# Patient Record
Sex: Female | Born: 1970 | Hispanic: No | Marital: Single | Smoking: Never smoker
Health system: Southern US, Academic
[De-identification: ages and names within clinical notes are randomized; demographics above are authoritative.]

## PROBLEM LIST (undated history)

## (undated) DIAGNOSIS — E119 Type 2 diabetes mellitus without complications: Secondary | ICD-10-CM

## (undated) DIAGNOSIS — F32A Depression, unspecified: Secondary | ICD-10-CM

## (undated) DIAGNOSIS — F419 Anxiety disorder, unspecified: Secondary | ICD-10-CM

## (undated) DIAGNOSIS — F329 Major depressive disorder, single episode, unspecified: Secondary | ICD-10-CM

## (undated) DIAGNOSIS — J45909 Unspecified asthma, uncomplicated: Secondary | ICD-10-CM

## (undated) HISTORY — PX: CHOLECYSTECTOMY: SHX55

## (undated) HISTORY — DX: Unspecified asthma, uncomplicated: J45.909

## (undated) HISTORY — DX: Anxiety disorder, unspecified: F41.9

## (undated) HISTORY — DX: Depression, unspecified: F32.A

## (undated) HISTORY — DX: Major depressive disorder, single episode, unspecified: F32.9

## (undated) HISTORY — PX: HX ENDOMETRIAL ABLATION: 2100001129

## (undated) HISTORY — PX: HX GALL BLADDER SURGERY/CHOLE: SHX55

## (undated) HISTORY — PX: HX GASTRIC BYPASS: SHX52

## (undated) HISTORY — DX: Type 2 diabetes mellitus without complications: E11.9

## (undated) HISTORY — PX: HX CARDIAC ABLATION: 2100001323

## (undated) HISTORY — PX: HX PACEMAKER INSERTION: 2100001161

## (undated) NOTE — Progress Notes (Signed)
Formatting of this note is different from the original.  Images from the original note were not included.    Cardiology Office Note:      Date:  03/08/2022     ID:  Grace Perez, DOB 04/03/1971, MRN [id number]    PCP:  Jamse Mead, MD   Cardiologist:  Garwin Brothers, MD     Referring MD: Jamse Mead, MD     ASSESSMENT:      1. Frequent PVCs    2. S/P bariatric surgery    3. Nonischemic cardiomyopathy (HCC)    4. Symptomatic PVCs      PLAN:      In order of problems listed above:    Primary prevention stressed with the patient.  Importance of compliance with diet and medication stressed and she vocalized understanding.  Nonischemic cardiomyopathy: I discussed findings with the patient at length.  Guideline directed medical therapy was considered.  Her blood pressure is very borderline.  She is concerned about hypotension with medication such as Entresto.  EKG done today reveals sinus rhythm and nonspecific ST-T changes.    Frequent PVCs with cardiomyopathy: Patient request second opinion and she mentions to me that she would seek an opinion at a tertiary center.  She was happy with the services that we have provided.  She will be seen in follow-up appointment in 9 months or earlier if she has any concerns.  I have mentioned to her that I would help her in every way such as providing all our records and sending a copy to whoever she wishes to see for this evaluation.  Patient had multiple questions which were answered to her satisfaction.    Medication Adjustments/Labs and Tests Ordered:  Current medicines are reviewed at length with the patient today.  Concerns regarding medicines are outlined above.   Orders Placed This Encounter   Procedures    EKG 12-Lead     No orders of the defined types were placed in this encounter.    Chief Complaint   Patient presents with    Dizziness    heart fluttering       History of Present Illness:      Grace Perez is a 25 y.o. female.  Patient has past medical  history of PVCs which are frequent and symptomatic.  She was concerned about it.  She was sent for ablation and possible evaluation for the same.  The ablation was not contacted as it was thought that the origin of the PVCs were close to the His bundle.  Subsequently the patient has undergone coronary angiography with anatomically normal-appearing coronary arteries and moderately depressed ejection fraction.  She is very concerned because she is very symptomatic from the PVCs and this affects her quality of life.  She is requesting a second opinion.  At the time of my evaluation, the patient is alert awake oriented and in no distress.    Past Medical History:   Diagnosis Date    Acute cystitis without hematuria 07/06/2021    Last Assessment & Plan:  Formatting of this note might be different from the original. POCT UA in the office today reveals + nitrites and hematuria. Will send UA for micro and reflex culture and treat with ABX as appropriate.    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 Patient reports decreased clitoral sensation in decrease or gas mixed response.  We discussed options  of compound medication which include constituents of nitroglycerin to improve blood flow.  [facility] in [place] provides 3 levels of compound cream.  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 Discussed findings of atrophic vaginitis. The pathophysiology of this disease process and the effects upon the vaginal epithelium and urothelium were reviewed in detail.   Options of local vaginal estrogen cream/pills/oral SERM were discussed. Benefits include restoration of  normal vaginal pH and thickening of     Cardiac murmur 07/15/2021    Cervical radiculitis 05/03/2017    Chest pain of  uncertain etiology 07/15/2021    Chronic midline low back pain without sciatica 06/28/2018    Formatting of this note might be different from the original. Last Assessment & Plan:  Formatting of this note might be different from the original. We discussed that at this time given she would not want any surgical intervention and does not have any worsening red flag symptoms, she would be unlikely to benefit from MRI. Will continue to work on weight loss to reduce weight on spine. Last Assess    Class 3 severe obesity due to excess calories with serious comorbidity and body mass index (BMI) of 45.0 to 49.9 in adult [facility]) 04/12/2018    Formatting of this note might be different from the original. Last Assessment & Plan:  Formatting of this note might be different from the original. BMI Assessment: Current Body mass index is 48.21 kg/m.  Patient BMI currently is above average (>25 kg/m2); BMI follow up plan is completed . Current barriers to healthy weight management include none.  BMI Plan:  Today, Alyssamae and I have discussed     COVID-19 virus infection 05/08/2020    Last Assessment & Plan:  Formatting of this note might be different from the original. Continues to be symptomatic with fatigue. Will provide work note for a few more days to see if she is able to return to work.    Depression     Dermatitis 08/18/2021    Last Assessment & Plan:  Formatting of this note might be different from the original. Chronic worse. Likely due to recurrent irritation from skin due to weight loss. Recommend cortisone bid until rash improves.    Essential hypertension 04/28/2016    Formatting of this note might be different from the original. Last Assessment & Plan:  Formatting of this note might be different from the original. Chronic, stable. Continue current rx regimen. Last Assessment & Plan:  Formatting of this note might be different from the original. Chronic, stable. Controlled. Continue with current regimen.    Frequent PVCs  07/15/2021    Gastroesophageal reflux disease 04/28/2016    Formatting of this note might be different from the original. Last Assessment & Plan:  Formatting of this note might be different from the original. Fu with GI for egd Last Assessment & Plan:  Formatting of this note might be different from the original. Fu with GI for egd    History of recurrent UTIs 07/06/2021    Last Assessment & Plan:  Formatting of this note might be different from the original. Patient reports longstanding history of UTIs. Discussed the importance of needing urine cultures when patient symptomatic with UTI since she is having recurrence of symptoms. She does have +nitrites  in urine today, will send for UA/culture today.    Hypokalemia 05/08/2020    Lymphadenopathy 10/17/2018    Last Assessment & Plan:  Formatting of this note might be different from the original. Improved on CT scan. Will plan for repeat in 3 months    Metabolic syndrome X 04/28/2016    Mixed anxiety and depressive disorder 05/03/2017    Mixed hyperlipidemia 05/31/2020    Last Assessment & Plan:  Formatting of this note might be different from the original. Chronic, not controlled. Will stop lipitor and change to crestor. Repeat labs prior to next visit.    Moderate obstructive sleep apnea 08/04/2020    Moderate persistent asthma without complication 12/05/2019    Last Assessment & Plan:  Formatting of this note might be different from the original. Chronic, stable. Controlled. Continue with current regimen.    Overactive bladder 07/06/2021    Last Assessment & Plan:  Formatting of this note might be different from the original. Given the patient's irritative voiding symptoms of urgency and/or frequency and/or nocturia, we will proceed with urodynamic testing. The [facility] urodynamic testing fact sheet was provided to the patient after review in the office. Patient will return the office after investigative procedures to review her results     Overweight with body mass  index (BMI) of 29 to 29.9 in adult 04/12/2018    Last Assessment & Plan:  Formatting of this note might be different from the original. BMI Assessment: Current Body mass index is 37 kg/m.  Patient BMI currently is above average (>25 kg/m2); BMI follow up plan is completed . Current barriers to healthy weight management include none.  BMI Plan:  Today, Bonny and I have discussed methods to help address her current weight.   congratulated on we    Palpitations 07/15/2021    Paresthesias 10/06/2021    Last Assessment & Plan:  Formatting of this note might be different from the original. Recommend checking labs given she is s/p gastric bypass. Has not had these checked in about 6 months. MRI w/wo and referral to neurology input. She's aware of ER precautions.    Primary insomnia 12/02/2021    Last Assessment & Plan:  Formatting of this note might be different from the original. Discussed good sleep hygiene habits.    Protein-calorie malnutrition (HCC) 10/15/2020    PVC's (premature ventricular contractions) 02/17/2022    Right sided numbness 05/08/2020    Last Assessment & Plan:  Formatting of this note might be different from the original. Chronic, resolved. MRI reviewed with no evidence of stroke    S/P bariatric surgery 10/15/2020    Spasm of thoracic back muscle 01/18/2018    Last Assessment & Plan:  Formatting of this note might be different from the original. Continue with the robaxin at night to prevent muscle spasms. I have placed a referral for you to see Dr. Erline Levine.    Tinea corporis 08/02/2021    Last Assessment & Plan:  Formatting of this note might be different from the original. Several erythematous, mildly raised papules which are well demarcated with some scaling and central clearing on bilateral forearms and anterior chest wall   She was seen at urgent care for this - started on PO antifungal - has taken 2 weeks of it (2 doses) No improvement  Will do topical antifungal daily - conti    Type 2 diabetes  mellitus without complication, without long-term current use of insulin (HCC) 04/28/2016    Formatting of  this note might be different from the original. Last Assessment & Plan:  Formatting of this note is different from the original. Pertinent Data:   Current medication includes: Ozempic pen injector - 1 mg/dose (2 mg/1.5 mL).  Hemoglobin A1C (%)  Date Value  10/31/2019 6.3 (H)  07/11/2019 6.6 (H)   Microalb/Creat Ratio (mg/g)  Date Value  07/11/2019 5   Creatinine (mg/dL)  Date Value      Urgency incontinence 07/06/2021     Past Surgical History:   Procedure Laterality Date    CHOLECYSTECTOMY      LEFT HEART CATH AND CORONARY ANGIOGRAPHY N/A 02/17/2022    Procedure: LEFT HEART CATH AND CORONARY ANGIOGRAPHY;  Surgeon: Orbie Pyo, MD;  Location: [facility];  Service: Cardiovascular;  Laterality: N/A;    PVC ABLATION N/A 02/17/2022    Procedure: PVC ABLATION;  Surgeon: Regan Lemming, MD;  Location: [facility];  Service: Cardiovascular;  Laterality: N/A;     Current Medications:  Current Meds   Medication Sig    aspirin EC 81 MG tablet Take 81 mg by mouth daily. Swallow whole.    BIOTIN PO Take 1 capsule by mouth daily.    COLLAGEN PO Take 1 Dose by mouth daily. Powder    flecainide (TAMBOCOR) 50 MG tablet Take 1 tablet (50 mg total) by mouth 2 (two) times daily.    levocetirizine (XYZAL) 5 MG tablet Take 5 mg by mouth daily as needed for allergies.    nystatin cream (MYCOSTATIN) Apply 1 application  topically 2 (two) times daily as needed for dry skin (rash).    OVER THE COUNTER MEDICATION Take 1 capsule by mouth daily. Bariatric vitamin    pantoprazole (PROTONIX) 40 MG tablet Take 40 mg by mouth as needed (reflux).    venlafaxine XR (EFFEXOR-XR) 37.5 MG 24 hr capsule Take 75 mg by mouth daily.    VITAMIN A PO Take 1 capsule by mouth daily.    VITAMIN D PO Take 1 capsule by mouth daily.    VITAMIN E PO Take 1 capsule by mouth daily.       Allergies:   Penicillins, Fexofenadine, Atorvastatin,  and Bupropion     Social History     Socioeconomic History    Marital status: Single     Spouse name: Not on file    Number of children: Not on file    Years of education: Not on file    Highest education level: Not on file   Occupational History    Not on file   Tobacco Use    Smoking status: Never    Smokeless tobacco: Never   Substance and Sexual Activity    Alcohol use: No     Alcohol/week: 0.0 standard drinks of alcohol    Drug use: No    Sexual activity: Never     Birth control/protection: Abstinence   Other Topics Concern    Not on file   Social History Narrative    Not on file     Social Determinants of Health     Financial Resource Strain: Not on file   Food Insecurity: Not on file   Transportation Needs: Not on file   Physical Activity: Not on file   Stress: Not on file   Social Connections: Not on file       Family History:  The patient's family history includes Hyperlipidemia in her brother, father, and mother; Hypertension in her brother, father, and mother.  ROS:    Please see the history of present illness.     All other systems reviewed and are negative.    EKGs/Labs/Other Studies Reviewed:      The following studies were reviewed today:  I discussed my findings with the patient at length echocardiogram and coronary angiography report was discussed with her at length.    Recent Labs:  02/14/2022: BUN 20; Creatinine, Ser 0.81; Hemoglobin 13.6; Platelets 249; Potassium 4.3; Sodium 145   Recent Lipid Panel  No results found for: "CHOL", "TRIG", "HDL", "CHOLHDL", "VLDL", "LDLCALC", "LDLDIRECT"    Physical Exam:      VS:  BP 110/72 (BP Location: Left Arm, Patient Position: Sitting)   Pulse (!) 51   Ht 5\' 2"  (1.575 m)   Wt 157 lb 3.2 oz (71.3 kg)   SpO2 98%   BMI 28.75 kg/m       Wt Readings from Last 3 Encounters:   03/08/22 157 lb 3.2 oz (71.3 kg)   02/17/22 155 lb (70.3 kg)   01/09/22 157 lb (71.2 kg)       GEN: Patient is in no acute distress  HEENT: Normal  NECK: No JVD; No carotid  bruits  LYMPHATICS: No lymphadenopathy  CARDIAC: Hear sounds regular, 2/6 systolic murmur at the apex.  RESPIRATORY:  Clear to auscultation without rales, wheezing or rhonchi   ABDOMEN: Soft, non-tender, non-distended  MUSCULOSKELETAL:  No edema; No deformity   SKIN: Warm and dry  NEUROLOGIC:  Alert and oriented x 3  PSYCHIATRIC:  Normal affect     Signed,  Garwin Brothers, MD   03/08/2022 9:44 AM     [facility]   Electronically signed by Garwin Brothers, MD at 03/08/2022  9:47 AM EDT

---

## 2009-01-15 ENCOUNTER — Other Ambulatory Visit: Payer: Self-pay

## 2009-01-18 LAB — HISTORICAL CYTOPATHOLOGY-GYN (PAP AND HPV TESTS)

## 2009-08-27 ENCOUNTER — Ambulatory Visit (HOSPITAL_COMMUNITY): Payer: Self-pay | Admitting: Emergency Medicine

## 2010-05-31 ENCOUNTER — Other Ambulatory Visit: Payer: Self-pay

## 2010-06-02 LAB — HISTORICAL CYTOPATHOLOGY-GYN (PAP AND HPV TESTS)

## 2010-11-26 ENCOUNTER — Emergency Department (HOSPITAL_COMMUNITY): Payer: Self-pay

## 2012-07-31 ENCOUNTER — Other Ambulatory Visit: Payer: Self-pay

## 2012-08-01 LAB — HISTORICAL SURGICAL PATHOLOGY SPECIMEN

## 2015-08-20 ENCOUNTER — Ambulatory Visit (INDEPENDENT_AMBULATORY_CARE_PROVIDER_SITE_OTHER): Payer: BLUE CROSS/BLUE SHIELD | Admitting: Family Medicine

## 2015-08-20 VITALS — BP 132/80 | HR 70 | Temp 98.6°F | Resp 17 | Ht 63.0 in | Wt 260.0 lb

## 2015-08-20 DIAGNOSIS — R059 Cough, unspecified: Secondary | ICD-10-CM

## 2015-08-20 DIAGNOSIS — R05 Cough: Secondary | ICD-10-CM

## 2015-08-20 DIAGNOSIS — R062 Wheezing: Secondary | ICD-10-CM | POA: Diagnosis not present

## 2015-08-20 DIAGNOSIS — J4531 Mild persistent asthma with (acute) exacerbation: Secondary | ICD-10-CM | POA: Diagnosis not present

## 2015-08-20 DIAGNOSIS — R52 Pain, unspecified: Secondary | ICD-10-CM

## 2015-08-20 LAB — POCT INFLUENZA A/B
Influenza A, POC: NEGATIVE
Influenza B, POC: NEGATIVE

## 2015-08-20 MED ORDER — FLUCONAZOLE 150 MG PO TABS: 150.0000 mg | ORAL_TABLET | Freq: Once | ORAL | Status: DC

## 2015-08-20 MED ORDER — PREDNISONE 20 MG PO TABS: ORAL_TABLET | ORAL | Status: DC

## 2015-08-20 MED ORDER — HYDROCODONE-HOMATROPINE 5-1.5 MG/5ML PO SYRP: 5.0000 mL | ORAL_SOLUTION | Freq: Three times a day (TID) | ORAL | Status: DC | PRN

## 2015-08-20 MED ORDER — ALBUTEROL SULFATE HFA 108 (90 BASE) MCG/ACT IN AERS: 2.0000 | INHALATION_SPRAY | Freq: Four times a day (QID) | RESPIRATORY_TRACT | Status: DC | PRN

## 2015-08-20 MED ORDER — BUDESONIDE-FORMOTEROL FUMARATE 160-4.5 MCG/ACT IN AERO: 2.0000 | INHALATION_SPRAY | Freq: Two times a day (BID) | RESPIRATORY_TRACT | Status: DC

## 2015-08-20 MED ORDER — AZITHROMYCIN 250 MG PO TABS: ORAL_TABLET | ORAL | Status: DC

## 2015-08-20 NOTE — Progress Notes (Signed)
[facility] [address] [place] [address] [phone]  Date:  08/20/2015   Name:  Molly Small   DOB:  July 25, 1971   MRN:  [id number]  PCP:  No primary care provider on file.    Chief Complaint: Cough; Hot Flashes; Chills; Dizziness; and Shortness of Breath   History of Present Illness:  Molly Small is a 44 y.o. very pleasant female patient who presents with the following:  Here today as a new patient with illness- her sx started a few days ago with ST, head congestion.  She felt a little bit better, but then yesterday she felt a lot worse all of a sudden.   She hs felt hot, then will have chills, she is wheezing.  No aches.   She states that " I get this about twice a year."  She has been using some vitamins at home but they have not seemed to help She was coughing up some material at first, but this is now stopped.    S/p endometrial ablation- no menses  She is in the process of moving her family from [place] to [place] where they plan to live [place] a farm.  She recently graduated from [employer] No GI symptoms.    She states that she often has to use steroids for this sort of problem.  Prednisone generally helps her to get better fast.   They have not checked her temp at home.   She does have a history of asthma and needs a RF of her spiriva and her albuterol.  When she is not sick she "never" needs the albuterol   There are no active problems to display for this patient.   Past Medical History  Diagnosis Date  . Anxiety   . Asthma   . Depression     Past Surgical History  Procedure Laterality Date  . Cholecystectomy      Social History  Substance Use Topics  . Smoking status: Never Smoker   . Smokeless tobacco: None  . Alcohol Use: No    Family History  Problem Relation Age of Onset  . Hypertension Mother   . Hyperlipidemia Mother   . Hypertension Father   . Hyperlipidemia Father   . Hyperlipidemia Brother   .  Hypertension Brother     Allergies  Allergen Reactions  . Allegra [Fexofenadine] Other (See Comments)    Numbness in face      Medication list has been reviewed and updated.  No current outpatient prescriptions on file prior to visit.   No current facility-administered medications on file prior to visit.    Review of Systems:  As per HPI- otherwise negative.   Physical Examination: Filed Vitals:   08/20/15 1531  BP: 132/80  Pulse: 100  Temp: 98.6 F (37 C)  Resp: 17   Filed Vitals:   08/20/15 1531  Height:  (1.6 m)  Weight: 260 lb (117.935 kg)   Body mass index is 46.07 kg/(m^2). Ideal Body Weight: Weight in (lb) to have BMI = 25: 140.8  GEN: WDWN, NAD, Non-toxic, A & O x 3, obese, looks well HEENT: Atraumatic, Normocephalic. Neck supple. No masses, No LAD.  Bilateral TM wnl, oropharynx normal.  PEERL,EOMI.   Ears and Nose: No external deformity. CV: RRR, No M/G/R. No JVD. No thrill. No extra heart sounds. PULM: CTA B, no wheezes, crackles, rhonchi. No retractions. No resp. distress. No accessory muscle use. EXTR: No c/c/e NEURO Normal  gait.  PSYCH: Normally interactive. Conversant. Not depressed or anxious appearing.  Calm demeanor.   Results for orders placed or performed in visit on 08/20/15  POCT Influenza A/B  Result Value Ref Range   Influenza A, POC Negative Negative   Influenza B, POC Negative Negative    Assessment and Plan: Asthma with acute exacerbation, mild persistent - Plan: predniSONE (DELTASONE) 20 MG tablet, albuterol (PROVENTIL HFA;VENTOLIN HFA) 108 (90 BASE) MCG/ACT inhaler, budesonide-formoterol (SYMBICORT) 160-4.5 MCG/ACT inhaler  Body aches - Plan: POCT Influenza A/B  Cough - Plan: HYDROcodone-homatropine (HYCODAN) 5-1.5 MG/5ML syrup, azithromycin (ZITHROMAX) 250 MG tablet, fluconazole (DIFLUCAN) 150 MG tablet  Wheezing  Here today with a likely viral illness.  rx prednisone and hycodan for her sx.  Refilled her symbicort and  albuterol rx for azithromycin (and diflucan) to hold- she will use this if not better in a few days  Meds ordered this encounter  Medications  . benazepril-hydrochlorthiazide (LOTENSIN HCT) 20-12.5 MG tablet    Sig: Take 1 tablet by mouth daily.  . montelukast (SINGULAIR) 10 MG tablet    Sig: Take 10 mg by mouth at bedtime.  Marland Kitchen HYDROcodone-homatropine (HYCODAN) 5-1.5 MG/5ML syrup    Sig: Take 5 mLs by mouth every 8 (eight) hours as needed for cough.    Dispense:  90 mL    Refill:  0  . predniSONE (DELTASONE) 20 MG tablet    Sig: Take 3 pills a day for 2 days, then 2 pills a day for 2 days, then 1 pill a day for 2 days    Dispense:  12 tablet    Refill:  0  . albuterol (PROVENTIL HFA;VENTOLIN HFA) 108 (90 BASE) MCG/ACT inhaler    Sig: Inhale 2 puffs into the lungs every 6 (six) hours as needed for wheezing or shortness of breath.    Dispense:  1 Inhaler    Refill:  0  . budesonide-formoterol (SYMBICORT) 160-4.5 MCG/ACT inhaler    Sig: Inhale 2 puffs into the lungs 2 (two) times daily.    Dispense:  1 Inhaler    Refill:  3  . azithromycin (ZITHROMAX) 250 MG tablet    Sig: Use as a zpack    Dispense:  6 tablet    Refill:  0  . fluconazole (DIFLUCAN) 150 MG tablet    Sig: Take 1 tablet (150 mg total) by mouth once. Repeat in 1 week if needed    Dispense:  2 tablet    Refill:  0     Signed Abbe Amsterdam, MD

## 2015-08-20 NOTE — Patient Instructions (Signed)
Use the inhalers as you usually do Use the cough syrup as needed but remember it makes you sleepy- do not use when you need to drive Use the prednisone as directed If you are not improved in a few days fill and use the azithromycin and diflucan rx Let me know if you need anything further.

## 2016-01-12 ENCOUNTER — Ambulatory Visit (INDEPENDENT_AMBULATORY_CARE_PROVIDER_SITE_OTHER): Payer: BLUE CROSS/BLUE SHIELD | Admitting: Family Medicine

## 2016-01-12 VITALS — BP 110/78 | HR 83 | Temp 98.0°F | Resp 16 | Ht 63.0 in | Wt 270.0 lb

## 2016-01-12 DIAGNOSIS — R1012 Left upper quadrant pain: Secondary | ICD-10-CM | POA: Diagnosis not present

## 2016-01-12 DIAGNOSIS — R110 Nausea: Secondary | ICD-10-CM | POA: Diagnosis not present

## 2016-01-12 LAB — POCT CBC
GRANULOCYTE PERCENT: 71.9 % (ref 37–80)
HCT, POC: 39.9 % (ref 37.7–47.9)
Hemoglobin: 13.9 g/dL (ref 12.2–16.2)
LYMPH, POC: 2.3 (ref 0.6–3.4)
MCH: 29.9 pg (ref 27–31.2)
MCHC: 34.9 g/dL (ref 31.8–35.4)
MCV: 85.5 fL (ref 80–97)
MID (CBC): 0.6 (ref 0–0.9)
MPV: 6.9 fL (ref 0–99.8)
POC Granulocyte: 7.5 — AB (ref 2–6.9)
POC LYMPH %: 22.3 % (ref 10–50)
POC MID %: 5.8 %M (ref 0–12)
Platelet Count, POC: 316 10*3/uL (ref 142–424)
RBC: 4.67 M/uL (ref 4.04–5.48)
RDW, POC: 13.9 %
WBC: 10.4 10*3/uL — AB (ref 4.6–10.2)

## 2016-01-12 MED ORDER — KETOROLAC TROMETHAMINE 30 MG/ML IJ SOLN
30.0000 mg | Freq: Once | INTRAMUSCULAR | Status: AC
  Administered 2016-01-12: 30 mg via INTRAMUSCULAR

## 2016-01-12 MED ORDER — ONDANSETRON 8 MG PO TBDP: 8.0000 mg | ORAL_TABLET | Freq: Three times a day (TID) | ORAL | Status: DC | PRN

## 2016-01-12 MED ORDER — ONDANSETRON 4 MG PO TBDP
8.0000 mg | ORAL_TABLET | Freq: Once | ORAL | Status: AC
  Administered 2016-01-12: 8 mg via ORAL

## 2016-01-12 NOTE — Patient Instructions (Signed)
     IF you received an x-ray today, you will receive an invoice from [facility]. Please contact [facility] at [phone] with questions or concerns regarding your invoice.   IF you received labwork today, you will receive an invoice from [facility]. Please contact [facility] at [phone] with questions or concerns regarding your invoice.   Our billing staff will not be able to assist you with questions regarding bills from these companies.  You will be contacted with the lab results as soon as they are available. The fastest way to get your results is to activate your My Chart account. Instructions are located on the last page of this paperwork. If you have not heard from us regarding the results in 2 weeks, please contact this office.      

## 2016-01-12 NOTE — Progress Notes (Signed)
Subjective:    Patient ID: Molly Small, female    DOB: [birth year], 45 y.o.   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 She presents today with 1 day of left upper quadrant pain. She was working from home when the pain started. Pain is dull with occasional feelings of pressure with some radiation to her back. Pain is constant and disrupted her sleep. Feels better if she stretches it. Did not take any medication for it. No diarrhea or constipation. Some waves of nausea, no vomiting. She has had history of pancreatitis following choleycystectomy. She was born with an extra bile duct. No recent fever or chills. Does not recall any unusual activity/stretching/straining lately. Has not taken any medication. Slept well last night.   No urinary symptoms, no hematuria, no dark urine. Last BM today- normal for her.   Past Medical History  Diagnosis Date  . Anxiety   . Asthma   . Depression    Past Surgical History  Procedure Laterality Date  . Cholecystectomy     Family History  Problem Relation Age of Onset  . Hypertension Mother   . Hyperlipidemia Mother   . Hypertension Father   . Hyperlipidemia Father   . Hyperlipidemia Brother   . Hypertension Brother    Social History  Substance Use Topics  . Smoking status: Never Smoker   . Smokeless tobacco: None  . Alcohol Use: No     Review of Systems Per HPI    Objective:   Physical Exam  Constitutional: She is oriented to person, place, and time. She appears well-developed and well-nourished.  HENT:  Head: Normocephalic and atraumatic.  Eyes: Conjunctivae are normal.  Neck: Normal range of motion. Neck supple.  Cardiovascular: Normal rate, regular rhythm and normal heart sounds.   Pulmonary/Chest: Effort normal and breath sounds normal.  Abdominal: Soft. Bowel sounds are normal. She exhibits no distension and no mass. There is no tenderness (I am unable to reproduce pain  with palpation). There is no rebound and no guarding.  Musculoskeletal: Normal range of motion.  Neurological: She is alert and oriented to person, place, and time.  Skin: Skin is warm and dry.  Psychiatric: She has a normal mood and affect. Her behavior is normal. Judgment and thought content normal.  Vitals reviewed.   BP 110/78 mmHg  Pulse 83  Temp(Src) 98 F (36.7 C) (Oral)  Resp 16  Ht 5\' 3"  (1.6 m)  Wt 270 lb (122.471 kg)  BMI 47.84 kg/m2  SpO2 96% Wt Readings from Last 3 Encounters:  01/12/16 270 lb (122.471 kg)  08/20/15 260 lb (117.935 kg)   CBC    Component Value Date/Time   WBC 10.4* 01/12/2016 1737   RBC 4.67 01/12/2016 1737   HGB 13.9 01/12/2016 1737   HCT 39.9 01/12/2016 1737   MCV 85.5 01/12/2016 1737   MCH 29.9 01/12/2016 1737   MCHC 34.9 01/12/2016 1737   Patient given toradol 60 mg IM and ondansetron 8 mg ODT with some relief of symptoms.      Assessment & Plan:  1. Abdominal pain, left upper quadrant - POCT CBC - COMPLETE METABOLIC PANEL WITH GFR - Amylase - Lipase - ketorolac (TORADOL) 30 MG/ML injection 30 mg; Inject 1 mL (30 mg total) into the muscle once. - unsure of etiology, CBC unremarkable. Afebrile, nontender. Will treat muscle strain/sprain with NSAIDs and dicussed RTC precautions  2. Nausea without vomiting - ondansetron (ZOFRAN-ODT)  8 MG disintegrating tablet; Take 1 tablet (8 mg total) by mouth every 8 (eight) hours as needed for nausea.  Dispense: 30 tablet; Refill: 0 - ondansetron (ZOFRAN-ODT) disintegrating tablet 8 mg; Take 2 tablets (8 mg total) by mouth once.   Olean Ree, FNP-BC  [facility], [facility]  01/14/2016 11:06 PM

## 2016-01-13 LAB — COMPLETE METABOLIC PANEL WITH GFR
ALK PHOS: 96 U/L (ref 33–115)
ALT: 23 U/L (ref 6–29)
AST: 16 U/L (ref 10–30)
Albumin: 4.4 g/dL (ref 3.6–5.1)
BILIRUBIN TOTAL: 0.5 mg/dL (ref 0.2–1.2)
BUN: 10 mg/dL (ref 7–25)
CHLORIDE: 102 mmol/L (ref 98–110)
CO2: 26 mmol/L (ref 20–31)
CREATININE: 0.8 mg/dL (ref 0.50–1.10)
Calcium: 9.5 mg/dL (ref 8.6–10.2)
GFR, Est Non African American: >89 mL/min (ref >=60)
Glucose, Bld: 90 mg/dL (ref 65–99)
Potassium: 4.1 mmol/L (ref 3.5–5.3)
Sodium: 138 mmol/L (ref 135–146)
Total Protein: 7.1 g/dL (ref 6.1–8.1)

## 2016-01-13 LAB — LIPASE: LIPASE: 18 U/L (ref 7–60)

## 2016-01-13 LAB — AMYLASE: AMYLASE: 32 U/L (ref 0–105)

## 2016-02-02 ENCOUNTER — Ambulatory Visit (INDEPENDENT_AMBULATORY_CARE_PROVIDER_SITE_OTHER): Payer: BLUE CROSS/BLUE SHIELD | Admitting: Family Medicine

## 2016-02-02 VITALS — BP 130/88 | HR 89 | Temp 98.6°F | Resp 17 | Ht 63.0 in | Wt 272.0 lb

## 2016-02-02 DIAGNOSIS — R140 Abdominal distension (gaseous): Secondary | ICD-10-CM | POA: Diagnosis not present

## 2016-02-02 DIAGNOSIS — R1084 Generalized abdominal pain: Secondary | ICD-10-CM

## 2016-02-02 NOTE — Progress Notes (Signed)
   Subjective:    Patient ID: Molly Small, female    DOB: 1970/09/22, 45 y.o.   MRN: [id number]  HPI This is a pleasant 45 yo female who presents today with continued abdominal pain. Was seen 01/12/16. Labs were normal. Abdominal pain got a little better then has gotten worse. She has tried zantac on top of her nexium. Feels very bloated. Having decent bowel movements. She is having excessive bloating. Feels miserable. Occasional sweats at night- perimenopause. Symptoms not related to foods.   Past Medical History  Diagnosis Date  . Anxiety   . Asthma   . Depression    Past Surgical History  Procedure Laterality Date  . Cholecystectomy     Family History  Problem Relation Age of Onset  . Hypertension Mother   . Hyperlipidemia Mother   . Hypertension Father   . Hyperlipidemia Father   . Hyperlipidemia Brother   . Hypertension Brother    Social History  Substance Use Topics  . Smoking status: Never Smoker   . Smokeless tobacco: Not on file  . Alcohol Use: No      Review of Systems No fever, no diarrhea, no constipation, + abdominal pain, no blood or mucus in stool.    Objective:   Physical Exam  Constitutional: She is oriented to person, place, and time. She appears well-developed and well-nourished. No distress.  Morbidly obese.   HENT:  Head: Normocephalic and atraumatic.  Eyes: Conjunctivae are normal.  Cardiovascular: Normal rate, regular rhythm and normal heart sounds.   Pulmonary/Chest: Effort normal and breath sounds normal.  Abdominal: Soft. Bowel sounds are normal. She exhibits distension. There is tenderness (generalized). There is no rebound and no guarding.  Neurological: She is alert and oriented to person, place, and time.  Skin: Skin is warm and dry. She is not diaphoretic.  Psychiatric: She has a normal mood and affect. Her behavior is normal. Judgment and thought content normal.  Vitals reviewed.     BP 130/88 mmHg  Pulse 89  Temp(Src) 98.6 F  (37 C) (Oral)  Resp 17  Ht 5\' 3"  (1.6 m)  Wt 272 lb (123.378 kg)  BMI 48.19 kg/m2  SpO2 98% Wt Readings from Last 3 Encounters:  02/02/16 272 lb (123.378 kg)  01/12/16 270 lb (122.471 kg)  08/20/15 260 lb (117.935 kg)       Assessment & Plan:  Reviewed with Dr. Katrinka Blazing 1. Generalized abdominal pain - CT Abdomen Pelvis W Contrast; Future  2. Abdominal bloating - CT Abdomen Pelvis W Contrast; Future - Advised to use Dulcolax/Miralax to see if having good bowel movement helps  Olean Ree, FNP-BC  [facility], [facility]  02/02/2016 1:42 PM

## 2016-02-02 NOTE — Patient Instructions (Addendum)
For acute constipation- Take 4 Dulcolax. Wait 1 hour then take 4-8 doses of Miralax over 2-3 hours For maintenance use Colace (stool softener, generic fine) and Miralax 1-3 doses daily until you are regulated Add a probiotic daily  If you have had adequate bowel movement in 24 hours, please return to clinic  We will call you about an appointment for your abdominal CT.     IF you received an x-ray today, you will receive an invoice from [facility]. Please contact [facility] at [phone] with questions or concerns regarding your invoice.   IF you received labwork today, you will receive an invoice from [employer]. Please contact [facility] at [phone] with questions or concerns regarding your invoice.   Our billing staff will not be able to assist you with questions regarding bills from these companies.  You will be contacted with the lab results as soon as they are available. The fastest way to get your results is to activate your My Chart account. Instructions are located on the last page of this paperwork. If you have not heard from [place] regarding the results in 2 weeks, please contact this office.

## 2016-02-07 ENCOUNTER — Other Ambulatory Visit: Payer: Self-pay | Admitting: Infectious Disease

## 2016-02-07 ENCOUNTER — Other Ambulatory Visit: Payer: Self-pay | Admitting: Family Medicine

## 2016-02-08 ENCOUNTER — Ambulatory Visit
Admission: RE | Admit: 2016-02-08 | Discharge: 2016-02-08 | Disposition: A | Discharge status: Home / Self Care | Payer: BLUE CROSS/BLUE SHIELD | Source: Ambulatory Visit | Attending: Family Medicine | Admitting: Family Medicine

## 2016-02-08 DIAGNOSIS — R140 Abdominal distension (gaseous): Secondary | ICD-10-CM

## 2016-02-08 DIAGNOSIS — R1084 Generalized abdominal pain: Secondary | ICD-10-CM

## 2016-02-08 MED ORDER — IOPAMIDOL (ISOVUE-300) INJECTION 61%
100.0000 mL | Freq: Once | INTRAVENOUS | Status: AC | PRN
  Administered 2016-02-08: 100 mL via INTRAVENOUS

## 2016-02-09 ENCOUNTER — Other Ambulatory Visit: Payer: Self-pay

## 2016-02-10 ENCOUNTER — Telehealth: Payer: Self-pay

## 2016-02-10 NOTE — Telephone Encounter (Signed)
Patient would like to get the results of her CT scan if someone could call her and let her know.  Her call back number is [phone]

## 2016-02-10 NOTE — Telephone Encounter (Signed)
Called and left patient a message that I will try her again tonight. When I call her back, I will put documentation in result note.

## 2016-02-16 NOTE — Progress Notes (Signed)
History and physical examinations reviewed with FNP Leone Payor. Agree with assessment and plan. Ziya Coonrod Paulita Fujita, M.D. [facility] [address] [place], [place]  [phone] phone [phone] fax

## 2016-04-03 ENCOUNTER — Ambulatory Visit: Payer: BLUE CROSS/BLUE SHIELD

## 2016-04-28 ENCOUNTER — Other Ambulatory Visit: Payer: Self-pay | Admitting: *Deleted

## 2016-04-28 DIAGNOSIS — K219 Gastro-esophageal reflux disease without esophagitis: Secondary | ICD-10-CM | POA: Insufficient documentation

## 2016-04-28 DIAGNOSIS — I10 Essential (primary) hypertension: Secondary | ICD-10-CM | POA: Insufficient documentation

## 2016-04-28 DIAGNOSIS — F419 Anxiety disorder, unspecified: Secondary | ICD-10-CM | POA: Insufficient documentation

## 2016-04-28 DIAGNOSIS — E8881 Metabolic syndrome: Secondary | ICD-10-CM | POA: Insufficient documentation

## 2016-04-28 DIAGNOSIS — J45909 Unspecified asthma, uncomplicated: Secondary | ICD-10-CM | POA: Insufficient documentation

## 2016-04-28 DIAGNOSIS — E119 Type 2 diabetes mellitus without complications: Secondary | ICD-10-CM | POA: Insufficient documentation

## 2016-04-28 DIAGNOSIS — Z1231 Encounter for screening mammogram for malignant neoplasm of breast: Secondary | ICD-10-CM

## 2016-04-28 HISTORY — DX: Metabolic syndrome: E88.81

## 2016-04-28 HISTORY — DX: Essential (primary) hypertension: I10

## 2016-04-28 HISTORY — DX: Gastro-esophageal reflux disease without esophagitis: K21.9

## 2016-04-28 HISTORY — DX: Metabolic syndrome: E88.810

## 2016-04-28 HISTORY — DX: Type 2 diabetes mellitus without complications: E11.9

## 2016-05-26 ENCOUNTER — Ambulatory Visit: Payer: BLUE CROSS/BLUE SHIELD

## 2016-09-28 ENCOUNTER — Ambulatory Visit
Admission: RE | Admit: 2016-09-28 | Discharge: 2016-09-28 | Disposition: A | Discharge status: Home / Self Care | Payer: BLUE CROSS/BLUE SHIELD | Source: Ambulatory Visit | Attending: *Deleted | Admitting: *Deleted

## 2016-09-28 DIAGNOSIS — Z1231 Encounter for screening mammogram for malignant neoplasm of breast: Secondary | ICD-10-CM

## 2017-01-03 ENCOUNTER — Ambulatory Visit (HOSPITAL_COMMUNITY)
Admission: RE | Admit: 2017-01-03 | Discharge: 2017-01-03 | Disposition: A | Discharge status: Home / Self Care | Payer: 59 | Source: Ambulatory Visit | Attending: *Deleted | Admitting: *Deleted

## 2017-01-03 ENCOUNTER — Other Ambulatory Visit: Payer: Self-pay | Admitting: *Deleted

## 2017-01-03 ENCOUNTER — Other Ambulatory Visit (HOSPITAL_COMMUNITY): Payer: Self-pay | Admitting: *Deleted

## 2017-01-03 DIAGNOSIS — M79662 Pain in left lower leg: Secondary | ICD-10-CM | POA: Insufficient documentation

## 2017-01-03 DIAGNOSIS — M79606 Pain in leg, unspecified: Secondary | ICD-10-CM

## 2017-01-03 DIAGNOSIS — M7989 Other specified soft tissue disorders: Principal | ICD-10-CM

## 2017-01-03 DIAGNOSIS — M79661 Pain in right lower leg: Secondary | ICD-10-CM

## 2017-01-03 NOTE — Progress Notes (Signed)
*  PRELIMINARY RESULTS* Vascular Ultrasound Bilateral lower extremity venous duplex has been completed.  Preliminary findings: No evidence of deep vein thrombosis or baker's cyst in the lower extremities.   Chauncey Fischer 01/03/2017, 2:32 PM

## 2017-05-03 DIAGNOSIS — M5412 Radiculopathy, cervical region: Secondary | ICD-10-CM | POA: Insufficient documentation

## 2017-05-03 DIAGNOSIS — F418 Other specified anxiety disorders: Secondary | ICD-10-CM | POA: Insufficient documentation

## 2017-05-03 HISTORY — DX: Other specified anxiety disorders: F41.8

## 2017-05-03 HISTORY — DX: Radiculopathy, cervical region: M54.12

## 2017-11-16 ENCOUNTER — Other Ambulatory Visit: Payer: Self-pay | Admitting: *Deleted

## 2017-11-16 DIAGNOSIS — Z1231 Encounter for screening mammogram for malignant neoplasm of breast: Secondary | ICD-10-CM

## 2017-11-30 ENCOUNTER — Ambulatory Visit: Payer: 59

## 2017-12-14 ENCOUNTER — Ambulatory Visit: Payer: 59

## 2018-01-15 IMAGING — CT CT ABD-PELV W/ CM
2 of 5 series · 16 of 46 positions shown, 18 images · IV contrast (APPLIED)
Comparison: None.

CLINICAL DATA: Generalized abd pain/ bloating x 2 mths GB No hx of
ca LMP; ABLATION , NO CHANCE OF PREG

EXAM:
CT ABDOMEN AND PELVIS WITH CONTRAST
TECHNIQUE: Multidetector CT imaging of the abdomen and pelvis was performed
using the standard protocol following bolus administration of
intravenous contrast.
CONTRAST:  100mL 9BK7G4-IPP IOPAMIDOL (9BK7G4-IPP) INJECTION 61%

[Series 2: abd/pelvis w/cm · axial · 0.84mm/px · z∈[+644,+1079]mm · 13 of 99 slices shown, 15 images]
[im 6/99  soft-tissue]
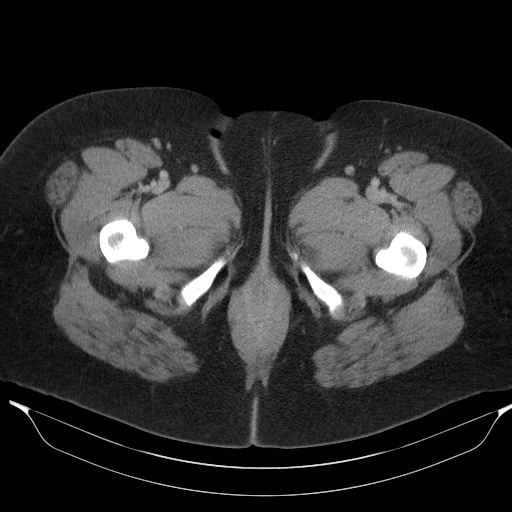
[im 6/99  bone]
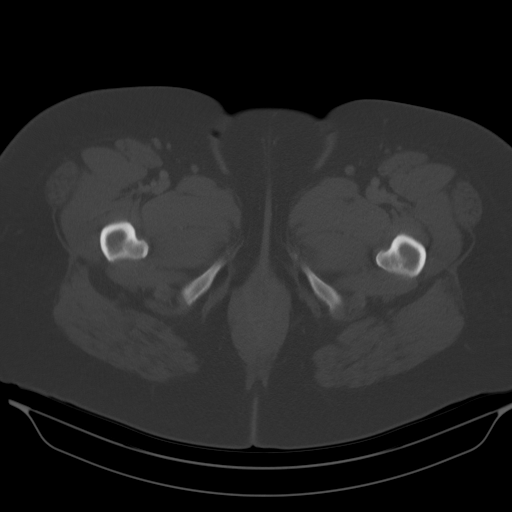
[im 11/99  soft-tissue]
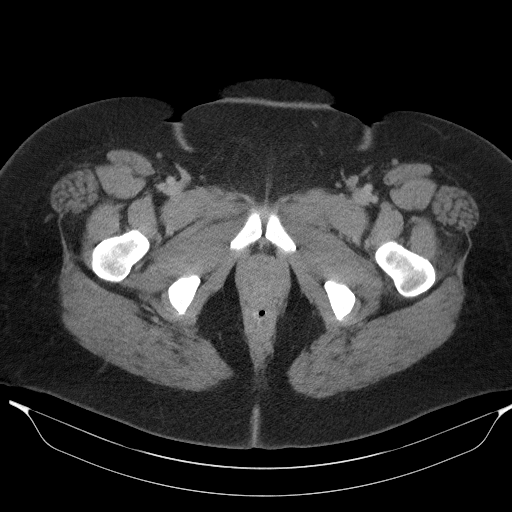
[im 22/99  soft-tissue]
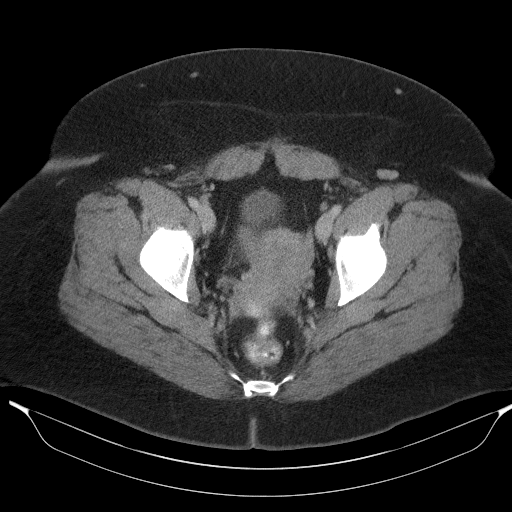
[im 28/99  soft-tissue]
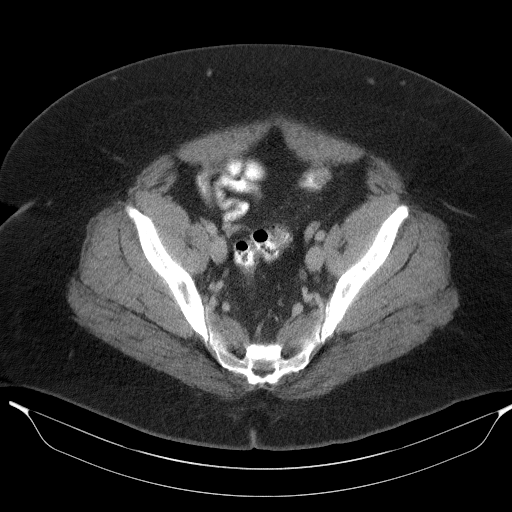
[im 33/99  soft-tissue]
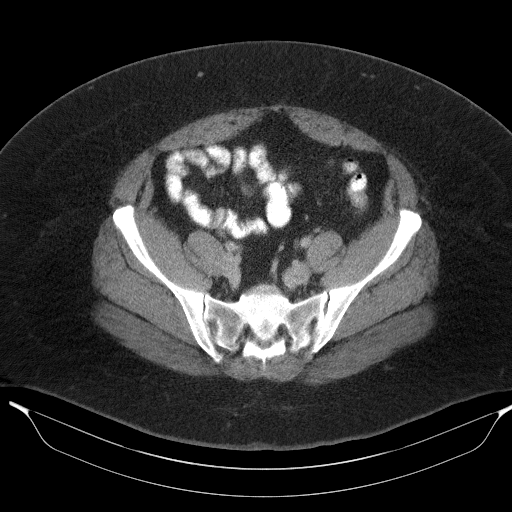
[im 44/99  soft-tissue]
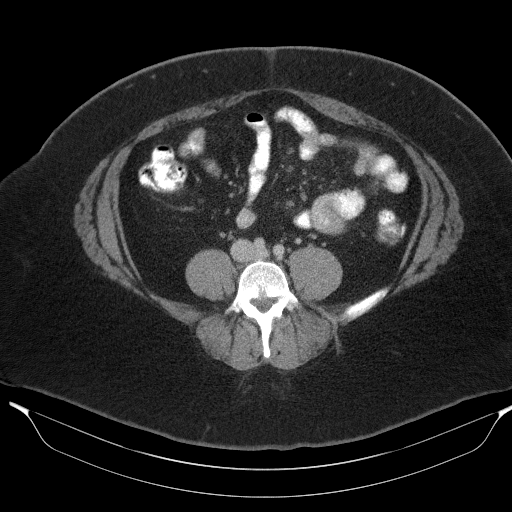
[im 50/99  soft-tissue]
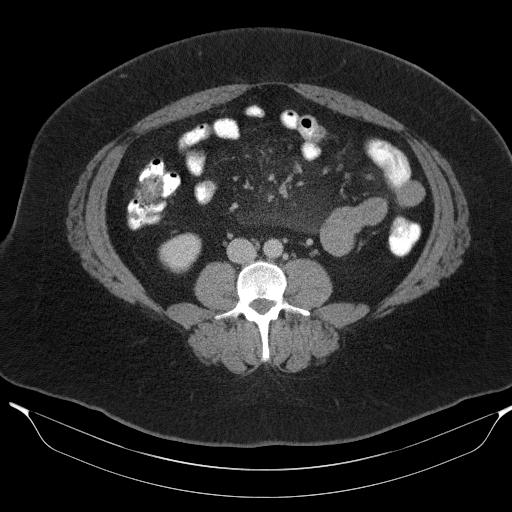
[im 55/99  soft-tissue]
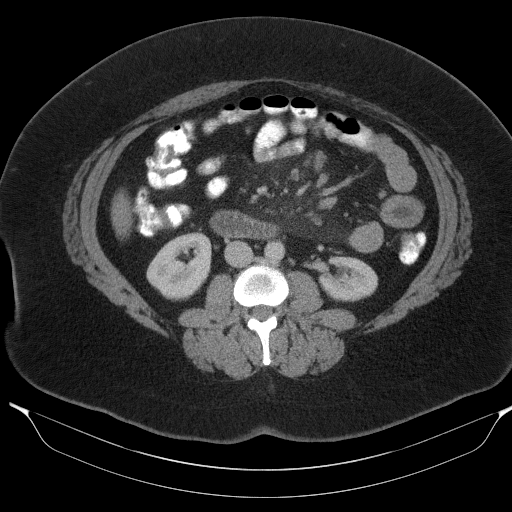
[im 66/99  soft-tissue]
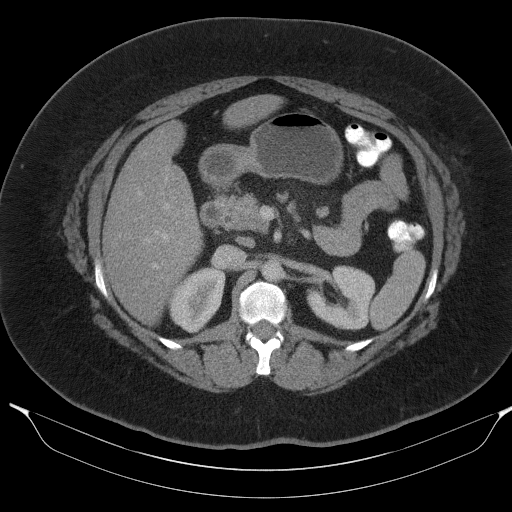
[im 66/99  bone]
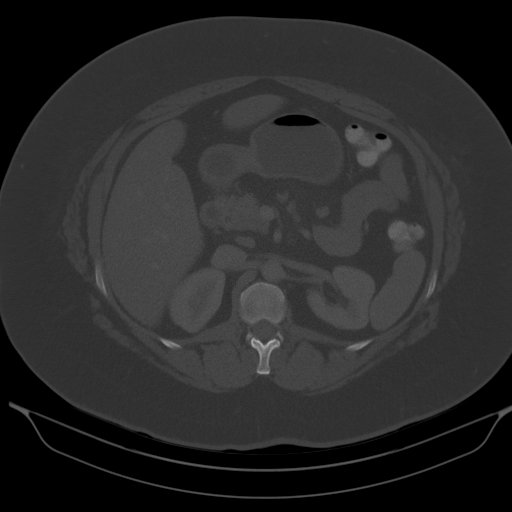
[im 71/99  soft-tissue]
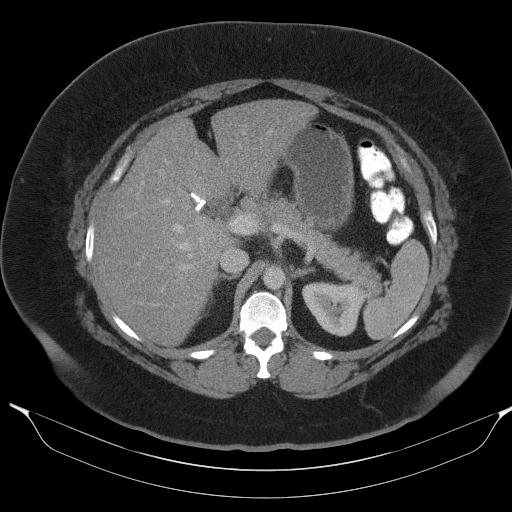
[im 77/99  soft-tissue]
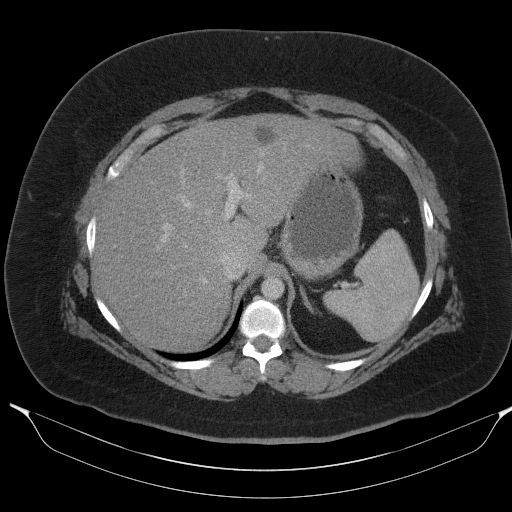
[im 88/99  soft-tissue]
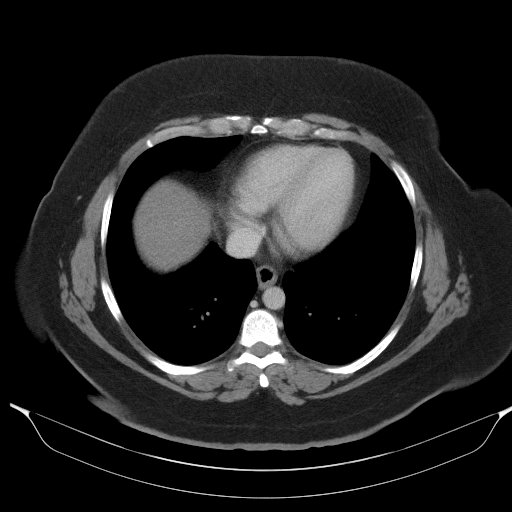
[im 93/99  soft-tissue]
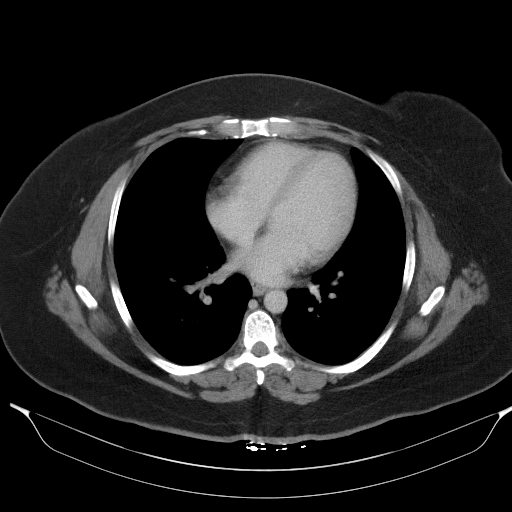

[Series 3: cor · coronal · 0.81mm/px · 3 of 118 slices shown]
[im 40/118  soft-tissue]
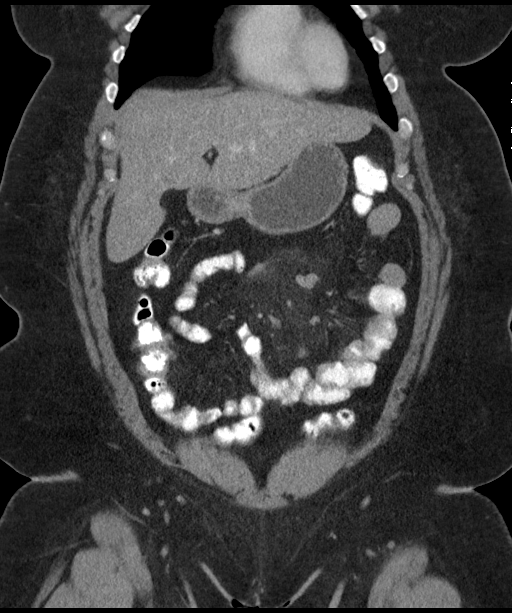
[im 53/118  soft-tissue]
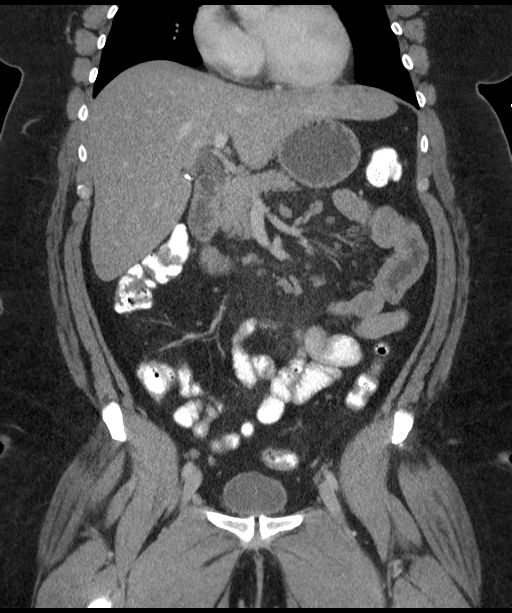
[im 66/118  soft-tissue]
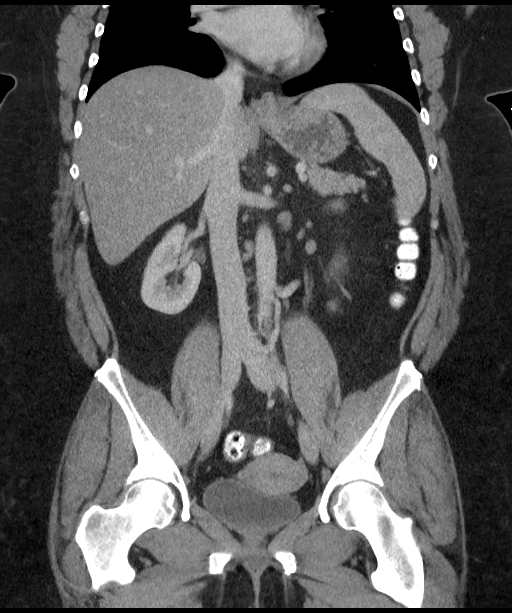

[16 of 46 positions shown; findings below may reference images not displayed]

FINDINGS: Lower chest:  No acute findings.

Hepatobiliary: Mild diffuse fatty infiltration of the liver. 2.6 cm
cyst in hepatic segment 2. Subcentimeter probable cyst in segment
4A. Previous cholecystectomy.

Pancreas: No mass, inflammatory changes, or other significant
abnormality.

Spleen: Within normal limits in size and appearance.

Adrenals/Urinary Tract: No masses identified. No evidence of
hydronephrosis.

Stomach/Bowel: No evidence of obstruction, inflammatory process, or
abnormal fluid collections. Normal appendix.

Vascular/Lymphatic: Central mesenteric adenopathy, nodes measured up
to 15 mm short axis diameter, with some mild hazy change in the
central mesentery. Subcentimeter left para-aortic and aortocaval
lymph nodes. Portal vein patent.

Reproductive: No mass or other significant abnormality.

Other: No ascites.  No free air.

Musculoskeletal:  No suspicious bone lesions identified.
IMPRESSION: 1. Central mesenteric adenopathy and "msity" mesentery, nonspecific
findings which may be reactive although early sclerosing
mesenteritis and lymphoma can have similar appearance. In the
absence of any concordant clinical findings, consider 3-6 month CT
follow-up to confirm stability.
2. Fatty liver with 2 benign cysts.

## 2018-01-18 DIAGNOSIS — M62830 Muscle spasm of back: Secondary | ICD-10-CM

## 2018-01-18 HISTORY — DX: Muscle spasm of back: M62.830

## 2018-02-07 ENCOUNTER — Encounter: Payer: Self-pay | Admitting: Family Medicine

## 2018-04-12 DIAGNOSIS — Z6841 Body Mass Index (BMI) 40.0 and over, adult: Secondary | ICD-10-CM | POA: Insufficient documentation

## 2018-04-12 DIAGNOSIS — E663 Overweight: Secondary | ICD-10-CM

## 2018-04-12 HISTORY — DX: Overweight: E66.3

## 2018-04-12 HISTORY — DX: Morbid (severe) obesity due to excess calories: E66.01

## 2018-06-28 DIAGNOSIS — G8929 Other chronic pain: Secondary | ICD-10-CM | POA: Insufficient documentation

## 2018-06-28 DIAGNOSIS — M545 Low back pain, unspecified: Secondary | ICD-10-CM | POA: Insufficient documentation

## 2018-06-28 HISTORY — DX: Other chronic pain: G89.29

## 2018-06-28 HISTORY — DX: Low back pain, unspecified: M54.50

## 2018-10-17 DIAGNOSIS — R591 Generalized enlarged lymph nodes: Secondary | ICD-10-CM | POA: Insufficient documentation

## 2018-10-17 HISTORY — DX: Generalized enlarged lymph nodes: R59.1

## 2019-12-05 DIAGNOSIS — J4540 Moderate persistent asthma, uncomplicated: Secondary | ICD-10-CM | POA: Insufficient documentation

## 2019-12-05 HISTORY — DX: Moderate persistent asthma, uncomplicated: J45.40

## 2020-05-08 DIAGNOSIS — E876 Hypokalemia: Secondary | ICD-10-CM | POA: Insufficient documentation

## 2020-05-08 DIAGNOSIS — R200 Anesthesia of skin: Secondary | ICD-10-CM | POA: Insufficient documentation

## 2020-05-08 DIAGNOSIS — U071 COVID-19: Secondary | ICD-10-CM

## 2020-05-08 HISTORY — DX: COVID-19: U07.1

## 2020-05-08 HISTORY — DX: Anesthesia of skin: R20.0

## 2020-05-08 HISTORY — DX: Hypokalemia: E87.6

## 2020-05-31 DIAGNOSIS — E782 Mixed hyperlipidemia: Secondary | ICD-10-CM | POA: Insufficient documentation

## 2020-05-31 HISTORY — DX: Mixed hyperlipidemia: E78.2

## 2020-08-04 DIAGNOSIS — G4733 Obstructive sleep apnea (adult) (pediatric): Secondary | ICD-10-CM | POA: Insufficient documentation

## 2020-08-04 HISTORY — DX: Obstructive sleep apnea (adult) (pediatric): G47.33

## 2020-10-15 DIAGNOSIS — Z9884 Bariatric surgery status: Secondary | ICD-10-CM | POA: Insufficient documentation

## 2020-10-15 DIAGNOSIS — E46 Unspecified protein-calorie malnutrition: Secondary | ICD-10-CM | POA: Insufficient documentation

## 2020-10-15 HISTORY — DX: Bariatric surgery status: Z98.84

## 2020-10-15 HISTORY — DX: Unspecified protein-calorie malnutrition: E46

## 2021-07-04 DIAGNOSIS — J45909 Unspecified asthma, uncomplicated: Secondary | ICD-10-CM | POA: Insufficient documentation

## 2021-07-04 DIAGNOSIS — F419 Anxiety disorder, unspecified: Secondary | ICD-10-CM | POA: Insufficient documentation

## 2021-07-06 DIAGNOSIS — Z87440 Personal history of urinary (tract) infections: Secondary | ICD-10-CM | POA: Insufficient documentation

## 2021-07-06 DIAGNOSIS — N952 Postmenopausal atrophic vaginitis: Secondary | ICD-10-CM | POA: Insufficient documentation

## 2021-07-06 DIAGNOSIS — N3281 Overactive bladder: Secondary | ICD-10-CM | POA: Insufficient documentation

## 2021-07-06 DIAGNOSIS — N3941 Urge incontinence: Secondary | ICD-10-CM | POA: Insufficient documentation

## 2021-07-06 DIAGNOSIS — N3000 Acute cystitis without hematuria: Secondary | ICD-10-CM

## 2021-07-06 HISTORY — DX: Acute cystitis without hematuria: N30.00

## 2021-07-06 HISTORY — DX: Urge incontinence: N39.41

## 2021-07-06 HISTORY — DX: Overactive bladder: N32.81

## 2021-07-06 HISTORY — DX: Postmenopausal atrophic vaginitis: N95.2

## 2021-07-06 HISTORY — DX: Personal history of urinary (tract) infections: Z87.440

## 2021-07-13 DIAGNOSIS — F32A Depression, unspecified: Secondary | ICD-10-CM | POA: Insufficient documentation

## 2021-07-15 ENCOUNTER — Encounter: Payer: Self-pay | Admitting: Cardiology

## 2021-07-15 ENCOUNTER — Ambulatory Visit (INDEPENDENT_AMBULATORY_CARE_PROVIDER_SITE_OTHER): Payer: 59 | Admitting: Cardiology

## 2021-07-15 ENCOUNTER — Ambulatory Visit (INDEPENDENT_AMBULATORY_CARE_PROVIDER_SITE_OTHER): Payer: 59

## 2021-07-15 ENCOUNTER — Other Ambulatory Visit: Payer: Self-pay

## 2021-07-15 VITALS — BP 98/64 | HR 77 | Ht 62.0 in | Wt 159.4 lb

## 2021-07-15 DIAGNOSIS — R011 Cardiac murmur, unspecified: Secondary | ICD-10-CM | POA: Insufficient documentation

## 2021-07-15 DIAGNOSIS — R079 Chest pain, unspecified: Secondary | ICD-10-CM | POA: Insufficient documentation

## 2021-07-15 DIAGNOSIS — R002 Palpitations: Secondary | ICD-10-CM | POA: Insufficient documentation

## 2021-07-15 DIAGNOSIS — I493 Ventricular premature depolarization: Secondary | ICD-10-CM | POA: Insufficient documentation

## 2021-07-15 DIAGNOSIS — R072 Precordial pain: Secondary | ICD-10-CM

## 2021-07-15 DIAGNOSIS — I10 Essential (primary) hypertension: Secondary | ICD-10-CM | POA: Diagnosis not present

## 2021-07-15 DIAGNOSIS — E119 Type 2 diabetes mellitus without complications: Secondary | ICD-10-CM

## 2021-07-15 DIAGNOSIS — E782 Mixed hyperlipidemia: Secondary | ICD-10-CM

## 2021-07-15 HISTORY — DX: Chest pain, unspecified: R07.9

## 2021-07-15 HISTORY — DX: Cardiac murmur, unspecified: R01.1

## 2021-07-15 HISTORY — DX: Palpitations: R00.2

## 2021-07-15 HISTORY — DX: Ventricular premature depolarization: I49.3

## 2021-07-15 NOTE — Patient Instructions (Signed)
Medication Instructions:  Your physician recommends that you continue on your current medications as directed. Please refer to the Current Medication list given to you today.  *If you need a refill on your cardiac medications before your next appointment, please call your pharmacy*   Lab Work: None ordered If you have labs (blood work) drawn today and your tests are completely normal, you will receive your results only by: MyChart Message (if you have MyChart) OR A paper copy in the mail If you have any lab test that is abnormal or we need to change your treatment, we will call you to review the results.   Testing/Procedures: Your physician has requested that you have a lexiscan myoview. For further information please visit [email or web address] Please follow instruction sheet, as given.  The test will take approximately 3 to 4 hours to complete; you may bring reading material.  If someone comes with you to your appointment, they will need to remain in the main lobby due to limited space in the testing area. **If you are pregnant or breastfeeding, please notify the nuclear lab prior to your appointment**  How to prepare for your Myocardial Perfusion Test: Do not eat or drink 3 hours prior to your test, except you may have water. Do not consume products containing caffeine (regular or decaffeinated) 12 hours prior to your test. (ex: coffee, chocolate, sodas, tea). Do bring a list of your current medications with you.  If not listed below, you may take your medications as normal. Do wear comfortable clothes (no dresses or overalls) and walking shoes, tennis shoes preferred (No heels or open toe shoes are allowed). Do NOT wear cologne, perfume, aftershave, or lotions (deodorant is allowed). If these instructions are not followed, your test will have to be rescheduled.  Your physician has requested that you have an echocardiogram. Echocardiography is a painless test that uses sound waves to  create images of your heart. It provides your doctor with information about the size and shape of your heart and how well your heart's chambers and valves are working. This procedure takes approximately one hour. There are no restrictions for this procedure.   WHY IS MY DOCTOR PRESCRIBING ZIO? The Zio system is proven and trusted by physicians to detect and diagnose irregular heart rhythms -- and has been prescribed to hundreds of thousands of patients.  The FDA has cleared the Zio system to monitor for many different kinds of irregular heart rhythms. In a study, physicians were able to reach a diagnosis 90% of the time with the Zio system1.  You can wear the Zio monitor -- a small, discreet, comfortable patch -- during your normal day-to-day activity, including while you sleep, shower, and exercise, while it records every single heartbeat for analysis.  1Barrett, P., et al. Comparison of 24 Hour Holter Monitoring Versus 14 Day Novel Adhesive Patch Electrocardiographic Monitoring. American Journal of Medicine, 2014.  ZIO VS. HOLTER MONITORING The Zio monitor can be comfortably worn for up to 14 days. Holter monitors can be worn for 24 to 48 hours, limiting the time to record any irregular heart rhythms you may have. Zio is able to capture data for the 51% of patients who have their first symptom-triggered arrhythmia after 48 hours.1  LIVE WITHOUT RESTRICTIONS The Zio ambulatory cardiac monitor is a small, unobtrusive, and water-resistant patch--you might even forget you're wearing it. The Zio monitor records and stores every beat of your heart, whether you're sleeping, working out, or showering. Wear the monitor for 14  days, remove 07/29/21. Follow-Up: At [facility], you and your health needs are our priority.  As part of our continuing mission to provide you with exceptional heart care, we have created designated Provider Care Teams.  These Care Teams include your primary Cardiologist  (physician) and Advanced Practice Providers (APPs -  Physician Assistants and Nurse Practitioners) who all work together to provide you with the care you need, when you need it.  We recommend signing up for the patient portal called "MyChart".  Sign up information is provided on this After Visit Summary.  MyChart is used to connect with patients for Virtual Visits (Telemedicine).  Patients are able to view lab/test results, encounter notes, upcoming appointments, etc.  Non-urgent messages can be sent to your provider as well.   To learn more about what you can do with MyChart, go to [email or web address].    Your next appointment:   1 month(s)  The format for your next appointment:   In Person  Provider:   Belva Crome, MD   Other Instructions Cardiac Nuclear Scan A cardiac nuclear scan is a test that is done to check the flow of blood to your heart. It is done when you are resting and when you are exercising. The test looks for problems such as: Not enough blood reaching a portion of the heart. The heart muscle not working as it should. You may need this test if: You have heart disease. You have had lab results that are not normal. You have had heart surgery or a balloon procedure to open up blocked arteries (angioplasty). You have chest pain. You have shortness of breath. In this test, a special dye (tracer) is put into your bloodstream. The tracer will travel to your heart. A camera will then take pictures of your heart to see how the tracer moves through your heart. This test is usually done at a hospital and takes 2-4 hours. Tell a doctor about: Any allergies you have. All medicines you are taking, including vitamins, herbs, eye drops, creams, and over-the-counter medicines. Any problems you or family members have had with anesthetic medicines. Any blood disorders you have. Any surgeries you have had. Any medical conditions you have. Whether you are pregnant or may be  pregnant. What are the risks? Generally, this is a safe test. However, problems may occur, such as: Serious chest pain and heart attack. This is only a risk if the stress portion of the test is done. Rapid heartbeat. A feeling of warmth in your chest. This feeling usually does not last long. Allergic reaction to the tracer. What happens before the test? Ask your doctor about changing or stopping your normal medicines. This is important. Follow instructions from your doctor about what you cannot eat or drink. Remove your jewelry on the day of the test. What happens during the test? An IV tube will be inserted into one of your veins. Your doctor will give you a small amount of tracer through the IV tube. You will wait for 20-40 minutes while the tracer moves through your bloodstream. Your heart will be monitored with an electrocardiogram (ECG). You will lie down on an exam table. Pictures of your heart will be taken for about 15-20 minutes. You may also have a stress test. For this test, one of these things may be done: You will be asked to exercise on a treadmill or a stationary bike. You will be given medicines that will make your heart work harder. This is done if you  are unable to exercise. When blood flow to your heart has peaked, a tracer will again be given through the IV tube. After 20-40 minutes, you will get back on the exam table. More pictures will be taken of your heart. Depending on the tracer that is used, more pictures may need to be taken 3-4 hours later. Your IV tube will be removed when the test is over. The test may vary among doctors and hospitals. What happens after the test? Ask your doctor: Whether you can return to your normal schedule, including diet, activities, and medicines. Whether you should drink more fluids. This will help to remove the tracer from your body. Drink enough fluid to keep your pee (urine) pale yellow. Ask your doctor, or the department that  is doing the test: When will my results be ready? How will I get my results? Summary A cardiac nuclear scan is a test that is done to check the flow of blood to your heart. Tell your doctor whether you are pregnant or may be pregnant. Before the test, ask your doctor about changing or stopping your normal medicines. This is important. Ask your doctor whether you can return to your normal activities. You may be asked to drink more fluids. This information is not intended to replace advice given to you by your health care provider. Make sure you discuss any questions you have with your health care provider. Document Revised: 12/25/2018 Document Reviewed: 02/18/2018 Elsevier Patient Education  2021 Elsevier Inc.    Echocardiogram An echocardiogram is a test that uses sound waves (ultrasound) to produce images of the heart. Images from an echocardiogram can provide important information about: Heart size and shape. The size and thickness and movement of your heart's walls. Heart muscle function and strength. Heart valve function or if you have stenosis. Stenosis is when the heart valves are too narrow. If blood is flowing backward through the heart valves (regurgitation). A tumor or infectious growth around the heart valves. Areas of heart muscle that are not working well because of poor blood flow or injury from a heart attack. Aneurysm detection. An aneurysm is a weak or damaged part of an artery wall. The wall bulges out from the normal force of blood pumping through the body. Tell a health care provider about: Any allergies you have. All medicines you are taking, including vitamins, herbs, eye drops, creams, and over-the-counter medicines. Any blood disorders you have. Any surgeries you have had. Any medical conditions you have. Whether you are pregnant or may be pregnant. What are the risks? Generally, this is a safe test. However, problems may occur, including an allergic reaction  to dye (contrast) that may be used during the test. What happens before the test? No specific preparation is needed. You may eat and drink normally. What happens during the test? You will take off your clothes from the waist up and put on a hospital gown. Electrodes or electrocardiogram (ECG)patches may be placed on your chest. The electrodes or patches are then connected to a device that monitors your heart rate and rhythm. You will lie down on a table for an ultrasound exam. A gel will be applied to your chest to help sound waves pass through your skin. A handheld device, called a transducer, will be pressed against your chest and moved over your heart. The transducer produces sound waves that travel to your heart and bounce back (or "echo" back) to the transducer. These sound waves will be captured in real-time and  changed into images of your heart that can be viewed on a video monitor. The images will be recorded on a computer and reviewed by your health care provider. You may be asked to change positions or hold your breath for a short time. This makes it easier to get different views or better views of your heart. In some cases, you may receive contrast through an IV in one of your veins. This can improve the quality of the pictures from your heart. The procedure may vary among health care providers and hospitals.    What can I expect after the test? You may return to your normal, everyday life, including diet, activities, and medicines, unless your health care provider tells you not to do that. Follow these instructions at home: It is up to you to get the results of your test. Ask your health care provider, or the department that is doing the test, when your results will be ready. Keep all follow-up visits. This is important. Summary An echocardiogram is a test that uses sound waves (ultrasound) to produce images of the heart. Images from an echocardiogram can provide important information  about the size and shape of your heart, heart muscle function, heart valve function, and other possible heart problems. You do not need to do anything to prepare before this test. You may eat and drink normally. After the echocardiogram is completed, you may return to your normal, everyday life, unless your health care provider tells you not to do that. This information is not intended to replace advice given to you by your health care provider. Make sure you discuss any questions you have with your health care provider. Document Revised: 04/27/2020 Document Reviewed: 04/27/2020 Elsevier Patient Education  2021 [employer].

## 2021-07-15 NOTE — Progress Notes (Signed)
Cardiology Office Note:    Date:  07/15/2021   ID:  Molly Small, DOB [birth year], MRN [id number]  PCP:  Jamse Mead, MD  Cardiologist:  Garwin Brothers, MD   Referring MD: Juel Burrow, NP    ASSESSMENT:    1. Essential hypertension   2. Type 2 diabetes mellitus without complication, without long-term current use of insulin (HCC)   3. Mixed hyperlipidemia   4. Precordial pain   5. Palpitations   6. Chest pain of uncertain etiology   7. Frequent PVCs   8. Cardiac murmur    PLAN:    In order of problems listed above:  Primary prevention stressed with the patient.  Importance of compliance with diet medication stressed and she vocalized understanding. Chest pain: Atypical etiology but in view of risk factors we will do an exercise stress Cardiolite and she is agreeable.  She knows to go to the nearest emergency room for any concerning symptoms. Cardiac murmur: Echocardiogram will be done to assess murmur heard on auscultation. Palpitations and frequent PVCs: In view of this I will do a 2-week monitoring to assess PVC burden she is agreeable. History of diabetes mellitus and hypertension now resolved with bariatric surgery.  I congratulated her and told her to be focused on exercise once her stress test is done and she is agreeable. Patient will be seen in follow-up appointment in 6 months or earlier if the patient has any concerns    Medication Adjustments/Labs and Tests Ordered: Current medicines are reviewed at length with the patient today.  Concerns regarding medicines are outlined above.  Orders Placed This Encounter  Procedures   MYOCARDIAL PERFUSION IMAGING   LONG TERM MONITOR (3-14 DAYS)   EKG 12-Lead   ECHOCARDIOGRAM COMPLETE    No orders of the defined types were placed in this encounter.    History of Present Illness:    Molly Small is a 50 y.o. female who is being seen today for the evaluation of chest pain at the request of Juel Burrow,  NP.  Patient is a pleasant 49 year old female.  She has past medical history of essential hypertension and mixed dyslipidemia and diabetes mellitus.  She was morbidly obese.  Subsequently she has had bariatric surgery and lost weight.  Now she does not take any medications for blood pressure or diabetes.  Patient mentions to me that she occasionally has chest discomfort which goes through her backs.  Not related to exertion.  No orthopnea or PND.  She is a Publishing rights manager by profession.  She also has a horse farm and she does not have any issues with ambulation on the farm.  Sexual activity does not bring around any symptoms of chest pain.  At the time of my evaluation, the patient is alert awake oriented and in no distress.  She occasionally has a sense of palpitations.  Past Medical History:  Diagnosis Date   Acute cystitis without hematuria 07/06/2021   Last Assessment & Plan:  Formatting of this note might be different from the original. POCT UA in the office today reveals + nitrites and hematuria. Will send UA for micro and reflex culture and treat with ABX as appropriate.   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 Discussed findings of atrophic vaginitis. The pathophysiology of this disease process and the effects upon the vaginal epithelium and urothelium were reviewed in detail.  Options of local vaginal estrogen cream/pills/oral SERM were discussed. Benefits include restoration of  normal vaginal pH and thickening of    Cervical radiculitis 05/03/2017   Chronic midline low back pain without sciatica 06/28/2018   Formatting of this note might be different from the original. Last Assessment & Plan:  Formatting of this note might be different from the original. We discussed that at this time given she would not want any surgical intervention and does not have any worsening red flag symptoms, she  would be unlikely to benefit from MRI. Will continue to work on weight loss to reduce weight on spine. Last Assess   Class 3 severe obesity due to excess calories with serious comorbidity and body mass index (BMI) of 45.0 to 49.9 in adult [facility]) 04/12/2018   Formatting of this note might be different from the original. Last Assessment & Plan:  Formatting of this note might be different from the original. BMI Assessment: Current Body mass index is 48.21 kg/m.  Patient BMI currently is above average (>25 kg/m2); BMI follow up plan is completed . Current barriers to healthy weight management include none.  BMI Plan:  Today, [name] and I have discussed    COVID-19 virus infection 05/08/2020   Last Assessment & Plan:  Formatting of this note might be different from the original. Continues to be symptomatic with fatigue. Will provide work note for a few more days to see if she is able to return to work.   Depression    Essential hypertension 04/28/2016   Formatting of this note might be different from the original. Last Assessment & Plan:  Formatting of this note might be different from the original. Chronic, stable. Continue current rx regimen. Last Assessment & Plan:  Formatting of this note might be different from the original. Chronic, stable. Controlled. Continue with current regimen.   Gastroesophageal reflux disease 04/28/2016   Formatting of this note might be different from the original. Last Assessment & Plan:  Formatting of this note might be different from the original. Fu with GI for egd Last Assessment & Plan:  Formatting of this note might be different from the original. Fu with GI for egd   History of recurrent UTIs 07/06/2021   Last Assessment & Plan:  Formatting of this note might be different from the original. Patient reports longstanding history of UTIs. Discussed the importance of needing urine cultures when patient symptomatic with UTI since she is having recurrence of symptoms. She does  have +nitrites in urine today, will send for UA/culture today.   Hypokalemia 05/08/2020   Lymphadenopathy 10/17/2018   Last Assessment & Plan:  Formatting of this note might be different from the original. Improved on CT scan. Will plan for repeat in 3 months   Metabolic syndrome X 04/28/2016   Mixed anxiety and depressive disorder 05/03/2017   Mixed hyperlipidemia 05/31/2020   Last Assessment & Plan:  Formatting of this note might be different from the original. Chronic, not controlled. Will stop lipitor and change to crestor. Repeat labs prior to next visit.   Moderate obstructive sleep apnea 08/04/2020   Moderate persistent asthma without complication 12/05/2019   Last Assessment & Plan:  Formatting of this note might be different from the original. Chronic, stable. Controlled. Continue with current regimen.   Overactive bladder 07/06/2021   Last Assessment & Plan:  Formatting of this note might be different from the original. Given the patient's irritative voiding symptoms of urgency and/or frequency and/or nocturia, we  will proceed with urodynamic testing. The [employer] urodynamic testing fact sheet was provided to the patient after review in the office. Patient will return the office after investigative procedures to review her results    Overweight with body mass index (BMI) of 29 to 29.9 in adult 04/12/2018   Last Assessment & Plan:  Formatting of this note might be different from the original. BMI Assessment: Current Body mass index is 37 kg/m.  Patient BMI currently is above average (>25 kg/m2); BMI follow up plan is completed . Current barriers to healthy weight management include none.  BMI Plan:  Today, Esty and I have discussed methods to help address her current weight.   congratulated on we   Protein-calorie malnutrition (HCC) 10/15/2020   Right sided numbness 05/08/2020   Last Assessment & Plan:  Formatting of this note might be different from the original. Chronic, resolved. MRI  reviewed with no evidence of stroke   S/P bariatric surgery 10/15/2020   Spasm of thoracic back muscle 01/18/2018   Last Assessment & Plan:  Formatting of this note might be different from the original. Continue with the robaxin at night to prevent muscle spasms. I have placed a referral for you to see Dr. Erline Levine.   Type 2 diabetes mellitus without complication, without long-term current use of insulin (HCC) 04/28/2016   Formatting of this note might be different from the original. Last Assessment & Plan:  Formatting of this note is different from the original. Pertinent Data:   Current medication includes: Ozempic pen injector - 1 mg/dose (2 mg/1.5 mL).  Hemoglobin A1C (%)  Date Value  10/31/2019 6.3 (H)  07/11/2019 6.6 (H)   Microalb/Creat Ratio (mg/g)  Date Value  07/11/2019 5   Creatinine (mg/dL)  Date Value     Urgency incontinence 07/06/2021    Past Surgical History:  Procedure Laterality Date   CHOLECYSTECTOMY      Current Medications: Current Meds  Medication Sig   albuterol (PROVENTIL HFA;VENTOLIN HFA) 108 (90 BASE) MCG/ACT inhaler Inhale 2 puffs into the lungs every 6 (six) hours as needed for wheezing or shortness of breath.   budesonide-formoterol (SYMBICORT) 160-4.5 MCG/ACT inhaler Inhale 2 puffs into the lungs 2 (two) times daily.   esomeprazole (NEXIUM) 40 MG capsule Take 40 mg by mouth daily at 12 noon.   levocetirizine (XYZAL) 5 MG tablet Take 5 mg by mouth every evening.   montelukast (SINGULAIR) 10 MG tablet Take 25 mg by mouth at bedtime.    pantoprazole (PROTONIX) 40 MG tablet Take 40 mg by mouth as needed (reflux).   venlafaxine XR (EFFEXOR-XR) 37.5 MG 24 hr capsule Take 37.5 mg by mouth 3 (three) times daily.     Allergies:   Penicillins, Fexofenadine, Atorvastatin, and Bupropion   Social History   Socioeconomic History   Marital status: Single    Spouse name: Not on file   Number of children: Not on file   Years of education: Not on file   Highest education  level: Not on file  Occupational History   Not on file  Tobacco Use   Smoking status: Never   Smokeless tobacco: Never  Substance and Sexual Activity   Alcohol use: No    Alcohol/week: 0.0 standard drinks   Drug use: No   Sexual activity: Never    Birth control/protection: Abstinence  Other Topics Concern   Not on file  Social History Narrative   Not on file   Social Determinants of Corporate investment banker  Strain: Not on file  Food Insecurity: Not on file  Transportation Needs: Not on file  Physical Activity: Not on file  Stress: Not on file  Social Connections: Not on file     Family History: The patient's family history includes Hyperlipidemia in her brother, father, and mother; Hypertension in her brother, father, and mother.  ROS:   Please see the history of present illness.    All other systems reviewed and are negative.  EKGs/Labs/Other Studies Reviewed:    The following studies were reviewed today: EKG reveals sinus rhythm and frequent PVCs   Recent Labs: No results found for requested labs within last 8760 hours.  Recent Lipid Panel No results found for: CHOL, TRIG, HDL, CHOLHDL, VLDL, LDLCALC, LDLDIRECT  Physical Exam:    VS:  BP 98/64   Pulse 77   Ht 5\' 2"  (1.575 m)   Wt 159 lb 6.4 oz (72.3 kg)   SpO2 98%   BMI 29.15 kg/m     Wt Readings from Last 3 Encounters:  07/15/21 159 lb 6.4 oz (72.3 kg)  02/02/16 272 lb (123.4 kg)  01/12/16 270 lb (122.5 kg)     GEN: Patient is in no acute distress HEENT: Normal NECK: No JVD; No carotid bruits LYMPHATICS: No lymphadenopathy CARDIAC: S1 S2 regular, 2/6 systolic murmur at the apex. RESPIRATORY:  Clear to auscultation without rales, wheezing or rhonchi  ABDOMEN: Soft, non-tender, non-distended MUSCULOSKELETAL:  No edema; No deformity  SKIN: Warm and dry NEUROLOGIC:  Alert and oriented x 3 PSYCHIATRIC:  Normal affect    Signed, 01/14/16, MD  07/15/2021 11:28 AM    [facility]

## 2021-07-19 ENCOUNTER — Ambulatory Visit (INDEPENDENT_AMBULATORY_CARE_PROVIDER_SITE_OTHER): Payer: 59

## 2021-07-19 ENCOUNTER — Other Ambulatory Visit: Payer: Self-pay

## 2021-07-19 DIAGNOSIS — R072 Precordial pain: Secondary | ICD-10-CM

## 2021-07-19 LAB — MYOCARDIAL PERFUSION IMAGING
Angina Index: 0
Duke Treadmill Score: 12
Estimated workload: 13.4
Exercise duration (min): 11 min
Exercise duration (sec): 30 s
LV dias vol: 128 mL (ref 46–106)
LV sys vol: 67 mL
MPHR: 171 {beats}/min
Nuc Stress EF: 48 %
Peak HR: 150 {beats}/min
Percent HR: 87 %
Rest HR: 63 {beats}/min
Rest Nuclear Isotope Dose: 10.9 mCi
SDS: 6
SRS: 2
SSS: 8
ST Depression (mm): 0 mm
Stress Nuclear Isotope Dose: 29.7 mCi
TID: 1.12

## 2021-07-19 MED ORDER — TECHNETIUM TC 99M TETROFOSMIN IV KIT
10.9000 | PACK | Freq: Once | INTRAVENOUS | Status: AC | PRN
  Administered 2021-07-19: 10.9 via INTRAVENOUS

## 2021-07-19 MED ORDER — TECHNETIUM TC 99M TETROFOSMIN IV KIT
29.7000 | PACK | Freq: Once | INTRAVENOUS | Status: AC | PRN
  Administered 2021-07-19: 29.7 via INTRAVENOUS

## 2021-07-20 ENCOUNTER — Ambulatory Visit (INDEPENDENT_AMBULATORY_CARE_PROVIDER_SITE_OTHER): Payer: 59

## 2021-07-20 ENCOUNTER — Telehealth: Payer: Self-pay

## 2021-07-20 DIAGNOSIS — R072 Precordial pain: Secondary | ICD-10-CM | POA: Diagnosis not present

## 2021-07-20 LAB — ECHOCARDIOGRAM COMPLETE
Area-P 1/2: 3.24 cm2
S' Lateral: 4.4 cm

## 2021-07-20 MED ORDER — PERFLUTREN LIPID MICROSPHERE
1.0000 mL | INTRAVENOUS | Status: AC | PRN
  Administered 2021-07-20: 6 mL via INTRAVENOUS

## 2021-07-20 NOTE — Telephone Encounter (Signed)
-----   Message from Garwin Brothers, MD sent at 07/20/2021  3:02 PM EDT ----- Ejection fraction is normal by echo.  The results of the study is unremarkable. Please inform patient. I will discuss in detail at next appointment. Cc  primary care/referring physician Garwin Brothers, MD 07/20/2021 3:02 PM

## 2021-07-20 NOTE — Telephone Encounter (Signed)
Left message on patients voicemail to please return our call.   

## 2021-08-02 DIAGNOSIS — B354 Tinea corporis: Secondary | ICD-10-CM | POA: Insufficient documentation

## 2021-08-02 HISTORY — DX: Tinea corporis: B35.4

## 2021-08-05 ENCOUNTER — Telehealth: Payer: Self-pay

## 2021-08-05 DIAGNOSIS — I493 Ventricular premature depolarization: Secondary | ICD-10-CM

## 2021-08-05 NOTE — Telephone Encounter (Signed)
Spoke with patient regarding results and recommendation.  She is still having frequent palpitations and states that they are not better at all. She would like to see the EP team and the referral was placed for her.   Patient verbalizes understanding and is agreeable to plan of care. Advised patient to call back with any issues or concerns.

## 2021-08-05 NOTE — Telephone Encounter (Signed)
-----   Message from Garwin Brothers, MD sent at 08/03/2021 11:47 AM EST ----- Please asked the patient how she is feeling.  If she has significant issues with palpitations I may need her to be referred to our electrophysiology colleagues because she has significant PVCs.  [facility] primary care Garwin Brothers, MD 08/03/2021 11:47 AM

## 2021-08-18 DIAGNOSIS — L309 Dermatitis, unspecified: Secondary | ICD-10-CM

## 2021-08-18 HISTORY — DX: Dermatitis, unspecified: L30.9

## 2021-08-19 ENCOUNTER — Ambulatory Visit: Payer: 59 | Admitting: Cardiology

## 2021-09-15 DIAGNOSIS — F5231 Female orgasmic disorder: Secondary | ICD-10-CM | POA: Insufficient documentation

## 2021-09-15 HISTORY — DX: Female orgasmic disorder: F52.31

## 2021-10-06 DIAGNOSIS — R202 Paresthesia of skin: Secondary | ICD-10-CM | POA: Insufficient documentation

## 2021-10-06 HISTORY — DX: Paresthesia of skin: R20.2

## 2021-10-10 ENCOUNTER — Encounter: Payer: Self-pay | Admitting: Cardiology

## 2021-10-10 ENCOUNTER — Ambulatory Visit (INDEPENDENT_AMBULATORY_CARE_PROVIDER_SITE_OTHER): Payer: 59 | Admitting: Cardiology

## 2021-10-10 ENCOUNTER — Other Ambulatory Visit: Payer: Self-pay

## 2021-10-10 VITALS — BP 114/72 | HR 70 | Ht 64.0 in | Wt 153.0 lb

## 2021-10-10 DIAGNOSIS — I493 Ventricular premature depolarization: Secondary | ICD-10-CM

## 2021-10-10 MED ORDER — FLECAINIDE ACETATE 50 MG PO TABS: 50.0000 mg | ORAL_TABLET | Freq: Two times a day (BID) | ORAL | 3 refills | Status: DC

## 2021-10-10 NOTE — Progress Notes (Signed)
Electrophysiology Office Note   Date:  10/10/2021   ID:  Molly Small, DOB 1971/01/04, MRN [id number]  PCP:  Julian Reil, MD  Cardiologist:  Revankar Primary Electrophysiologist:  Rusty Glodowski Meredith Leeds, MD    Chief Complaint: PVC   History of Present Illness: Molly Small is a 51 y.o. female who is being seen today for the evaluation of PVC at the request of Revankar, Molly Cliche, MD. Presenting today for electrophysiology evaluation.  She has a history significant for metabolic syndrome, hyperlipidemia, obstructive sleep apnea, type 2 diabetes, morbid obesity status post bariatric surgery.  After her bariatric surgery, she was able to stop medications for both diabetes and hypertension.  She presented to cardiology clinic with palpitations.  She was noted to have an elevated PVC burden.  Today, she denies symptoms of palpitations, orthopnea, PND, lower extremity edema, claudication, dizziness, presyncope, syncope, bleeding, or neurologic sequela. The patient is tolerating medications without difficulties.  Today she feels well.  She has no chest pain or shortness of breath.  Is able to do all of her daily activities.  There are times that she does have palpitations, chest pain, shortness of breath.  She is also been to her primary physician's office and was noted to be bradycardic.   Past Medical History:  Diagnosis Date   Acute cystitis without hematuria 07/06/2021   Last Assessment & Plan:  Formatting of this note might be different from the original. POCT UA in the office today reveals + nitrites and hematuria. Jeanni Allshouse send UA for micro and reflex culture and treat with ABX as appropriate.   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 Discussed findings of atrophic vaginitis. The pathophysiology of this disease process and the effects upon the vaginal epithelium and urothelium were  reviewed in detail.   Options of local vaginal estrogen cream/pills/oral SERM were discussed. Benefits include restoration of  normal vaginal pH and thickening of    Cervical radiculitis 05/03/2017   Chronic midline low back pain without sciatica 06/28/2018   Formatting of this note might be different from the original. Last Assessment & Plan:  Formatting of this note might be different from the original. We discussed that at this time given she would not want any surgical intervention and does not have any worsening red flag symptoms, she would be unlikely to benefit from MRI. Molly Small continue to work on weight loss to reduce weight on spine. Last Assess   Class 3 severe obesity due to excess calories with serious comorbidity and body mass index (BMI) of 45.0 to 49.9 in adult [facility]) 04/12/2018   Formatting of this note might be different from the original. Last Assessment & Plan:  Formatting of this note might be different from the original. BMI Assessment: Current Body mass index is 48.21 kg/m.  Patient BMI currently is above average (>25 kg/m2); BMI follow up plan is completed . Current barriers to healthy weight management include none.  BMI Plan:  Today, Glory and I have discussed    COVID-19 virus infection 05/08/2020   Last Assessment & Plan:  Formatting of this note might be different from the original. Continues to be symptomatic with fatigue. Parneet Glantz provide work note for a few more days to see if she is able to return to work.   Depression    Essential hypertension 04/28/2016   Formatting of this note might be  different from the original. Last Assessment & Plan:  Formatting of this note might be different from the original. Chronic, stable. Continue current rx regimen. Last Assessment & Plan:  Formatting of this note might be different from the original. Chronic, stable. Controlled. Continue with current regimen.   Gastroesophageal reflux disease 04/28/2016   Formatting of this note might be  different from the original. Last Assessment & Plan:  Formatting of this note might be different from the original. Fu with GI for egd Last Assessment & Plan:  Formatting of this note might be different from the original. Fu with GI for egd   History of recurrent UTIs 07/06/2021   Last Assessment & Plan:  Formatting of this note might be different from the original. Patient reports longstanding history of UTIs. Discussed the importance of needing urine cultures when patient symptomatic with UTI since she is having recurrence of symptoms. She does have +nitrites in urine today, Molly Small send for UA/culture today.   Hypokalemia 05/08/2020   Lymphadenopathy 10/17/2018   Last Assessment & Plan:  Formatting of this note might be different from the original. Improved on CT scan. Mccayla Shimada plan for repeat in 3 months   Metabolic syndrome X [id number]   Mixed anxiety and depressive disorder 05/03/2017   Mixed hyperlipidemia 05/31/2020   Last Assessment & Plan:  Formatting of this note might be different from the original. Chronic, not controlled. Molly Small stop lipitor and change to crestor. Repeat labs prior to next visit.   Moderate obstructive sleep apnea 08/04/2020   Moderate persistent asthma without complication [id number]   Last Assessment & Plan:  Formatting of this note might be different from the original. Chronic, stable. Controlled. Continue with current regimen.   Overactive bladder 07/06/2021   Last Assessment & Plan:  Formatting of this note might be different from the original. Given the patient's irritative voiding symptoms of urgency and/or frequency and/or nocturia, we Shaindy Reader proceed with urodynamic testing. The AUGS urodynamic testing fact sheet was provided to the patient after review in the office. Patient Tyger Wichman return the office after investigative procedures to review her results    Overweight with body mass index (BMI) of 29 to 29.9 in adult 04/12/2018   Last Assessment & Plan:  Formatting of this  note might be different from the original. BMI Assessment: Current Body mass index is 37 kg/m.  Patient BMI currently is above average (>25 kg/m2); BMI follow up plan is completed . Current barriers to healthy weight management include none.  BMI Plan:  Today, Denell and I have discussed methods to help address her current weight.   congratulated on we   Protein-calorie malnutrition ([place]) 10/15/2020   Right sided numbness 05/08/2020   Last Assessment & Plan:  Formatting of this note might be different from the original. Chronic, resolved. MRI reviewed with no evidence of stroke   S/P bariatric surgery 10/15/2020   Spasm of thoracic back muscle 01/18/2018   Last Assessment & Plan:  Formatting of this note might be different from the original. Continue with the robaxin at night to prevent muscle spasms. I have placed a referral for you to see Dr. Cloyd Stagers.   Type 2 diabetes mellitus without complication, without long-term current use of insulin ([place]) 04/28/2016   Formatting of this note might be different from the original. Last Assessment & Plan:  Formatting of this note is different from the original. Pertinent Data:   Current medication includes: Ozempic pen injector - 1 mg/dose (2 mg/1.5 mL).  Hemoglobin A1C (%)  Date Value  10/31/2019 6.3 (H)  07/11/2019 6.6 (H)   Microalb/Creat Ratio (mg/g)  Date Value  07/11/2019 5   Creatinine (mg/dL)  Date Value     Urgency incontinence 07/06/2021   Past Surgical History:  Procedure Laterality Date   CHOLECYSTECTOMY       Current Outpatient Medications  Medication Sig Dispense Refill   albuterol (PROVENTIL HFA;VENTOLIN HFA) 108 (90 BASE) MCG/ACT inhaler Inhale 2 puffs into the lungs every 6 (six) hours as needed for wheezing or shortness of breath. 1 Inhaler 0   budesonide-formoterol (SYMBICORT) 160-4.5 MCG/ACT inhaler Inhale 2 puffs into the lungs 2 (two) times daily. 1 Inhaler 3   esomeprazole (NEXIUM) 40 MG capsule Take 40 mg by mouth daily at 12 noon.      levocetirizine (XYZAL) 5 MG tablet Take 5 mg by mouth every evening.     montelukast (SINGULAIR) 10 MG tablet Take 25 mg by mouth at bedtime.      pantoprazole (PROTONIX) 40 MG tablet Take 40 mg by mouth as needed (reflux).     venlafaxine XR (EFFEXOR-XR) 37.5 MG 24 hr capsule Take 37.5 mg by mouth 3 (three) times daily.     No current facility-administered medications for this visit.    Allergies:   Penicillins, Fexofenadine, Atorvastatin, and Bupropion   Social History:  The patient  reports that she has never smoked. She has never used smokeless tobacco. She reports that she does not drink alcohol and does not use drugs.   Family History:  The patient's family history includes Hyperlipidemia in her brother, father, and mother; Hypertension in her brother, father, and mother.    ROS:  Please see the history of present illness.   Otherwise, review of systems is positive for none.   All other systems are reviewed and negative.    PHYSICAL EXAM: VS:  BP 114/72    Pulse 70    Ht 5\' 4"  (1.626 m)    Wt 153 lb (69.4 kg)    SpO2 98%    BMI 26.26 kg/m  , BMI Body mass index is 26.26 kg/m. GEN: Well nourished, well developed, in no acute distress  HEENT: normal  Neck: no JVD, carotid bruits, or masses Cardiac: RRR; no murmurs, rubs, or gallops,no edema  Respiratory:  clear to auscultation bilaterally, normal work of breathing GI: soft, nontender, nondistended, + BS MS: no deformity or atrophy  Skin: warm and dry Neuro:  Strength and sensation are intact Psych: euthymic mood, full affect  EKG:  EKG is ordered today. Personal review of the ekg ordered shows sinus rhythm, rate 70  Recent Labs: No results found for requested labs within last 8760 hours.    Lipid Panel  No results found for: CHOL, TRIG, HDL, CHOLHDL, VLDL, LDLCALC, LDLDIRECT   Wt Readings from Last 3 Encounters:  10/10/21 153 lb (69.4 kg)  07/19/21 159 lb (72.1 kg)  07/15/21 159 lb 6.4 oz (72.3 kg)      Other  studies Reviewed: Additional studies/ records that were reviewed today include: TTE 06/19/21  Review of the above records today demonstrates:   1. Left ventricular ejection fraction, by estimation, is 55 to 60%. The  left ventricle has normal function. The left ventricle has no regional  wall motion abnormalities. Left ventricular diastolic parameters were  normal.   2. Left atrial size was moderately dilated.   Myoview 07/19/21   Findings are consistent withprior ischemia and no prior myocardial infarction. The study is  low risk.   No ST deviation was noted.   Left ventricular function is abnormal. Global function is mildly reduced. Nuclear stress EF: 48 %. The left ventricular ejection fraction is mildly decreased (45-54%). End diastolic cavity size is normal.   Prior study not available for comparison.   Good exercise tolerance - 34min 30 sec of Bruce protocol, negative stress test for ischemia. Noted mildly reduced EF. Consider echocardiogram to confirm.  Cardiac monitor 08/03/21 Predominant underlying rhythm was sinus rhythm 14.1% PVC burden  ASSESSMENT AND PLAN:  1.  PVCs: 14% burden noted on cardiac monitor.  It seems that her symptoms of chest pain, shortness of breath, fatigue correlate with her elevated PVC burden.  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  She is unfortunately not having PVCs today.  Due to that, we Zoii Florer hold off on ablation for her PVCs.  She is okay with medication management.  We Arianna Haydon plan to start her on 50 mg of flecainide.  If this does not control her PVCs, we Mayte Diers likely plan to increase the dose to 100 mg.  Case discussed with referring cardiologist  Current medicines are reviewed at length with the patient today.   The patient does not have concerns regarding her medicines.  The following changes were made today: Start flecainide  Labs/ tests ordered today include:  Orders Placed This Encounter  Procedures   EKG 12-Lead      Disposition:   FU with Horace Wishon 3 months  Signed, Nafeesah Lapaglia Meredith Leeds, MD  10/10/2021 9:56 AM     [place] [address] [phone] (office) [phone] (fax)

## 2021-10-10 NOTE — Patient Instructions (Signed)
Medication Instructions:  Your physician has recommended you make the following change in your medication:  START Flecainide 50 mg twice daily Please call the office in next 2 weeks and let us know how you are doing on the new medication  *If you need a refill on your cardiac medications before your next appointment, please call your pharmacy*   Lab Work: None ordered   Testing/Procedures: None ordered   Follow-Up: At [facility], you and your health needs are our priority.  As part of our continuing mission to provide you with exceptional heart care, we have created designated Provider Care Teams.  These Care Teams include your primary Cardiologist (physician) and Advanced Practice Providers (APPs -  Physician Assistants and Nurse Practitioners) who all work together to provide you with the care you need, when you need it.  Your next appointment:   3 month(s)  The format for your next appointment:   In Person  Provider:   Loman Brooklyn, MD    Thank you for choosing [facility]!!   Dory Horn, RN [phone]   Other Instructions   Flecainide Tablets What is this medication? FLECAINIDE (FLEK a nide) prevents and treats a fast or irregular heartbeat (arrhythmia). It is often used to treat a type of arrhythmia known as AFib (atrial fibrillation). It works by slowing down overactive electric signals in the heart, which stabilizes your heart rhythm. It belongs to a group of medications called antiarrhythmics. This medicine may be used for other purposes; ask your health care provider or pharmacist if you have questions. COMMON BRAND NAME(S): Tambocor What should I tell my care team before I take this medication? They need to know if you have any of these conditions: Abnormal levels of potassium in the blood Heart disease including heart rhythm and heart rate problems Kidney or liver disease Recent heart attack An unusual or allergic reaction to flecainide,  local anesthetics, other medications, foods, dyes, or preservatives Pregnant or trying to get pregnant Breast-feeding How should I use this medication? Take this medication by mouth with a glass of water. Follow the directions on the prescription label. You can take this medication with or without food. Take your doses at regular intervals. Do not take your medication more often than directed. Do not stop taking this medication suddenly. This may cause serious, heart-related side effects. If your care team wants you to stop the medication, the dose may be slowly lowered over time to avoid any side effects. Talk to your care team regarding the use of this medication in children. While this medication may be prescribed for children as young as 1 year of age for selected conditions, precautions do apply. Overdosage: If you think you have taken too much of this medicine contact a poison control center or emergency room at once. NOTE: This medicine is only for you. Do not share this medicine with others. What if I miss a dose? If you miss a dose, take it as soon as you can. If it is almost time for your next dose, take only that dose. Do not take double or extra doses. What may interact with this medication? Do not take this medication with any of the following: Amoxapine Arsenic trioxide Certain antibiotics like clarithromycin, erythromycin, gatifloxacin, gemifloxacin, levofloxacin, moxifloxacin, sparfloxacin, or troleandomycin Certain antidepressants called tricyclic antidepressants like amitriptyline, imipramine, or nortriptyline Certain medications to control heart rhythm like disopyramide, encainide, moricizine, procainamide, propafenone, and quinidine Cisapride Delavirdine Droperidol Haloperidol [place] Imatinib Levomethadyl Maprotiline Medications for malaria  like chloroquine and halofantrine Pentamidine Phenothiazines like chlorpromazine, mesoridazine, prochlorperazine,  thioridazine Pimozide Quinine Ranolazine Ritonavir Sertindole This medication may also interact with the following: Cimetidine Dofetilide Medications for angina or high blood pressure Medications to control heart rhythm like amiodarone and digoxin Ziprasidone This list may not describe all possible interactions. Give your health care provider a list of all the medicines, herbs, non-prescription drugs, or dietary supplements you use. Also tell them if you smoke, drink alcohol, or use illegal drugs. Some items may interact with your medicine. What should I watch for while using this medication? Visit your care team for regular checks on your progress. Because your condition and the use of this medication carries some risk, it is a good idea to carry an identification card, necklace or bracelet with details of your condition, medications, and care team. Check your blood pressure and pulse rate regularly. Ask your care team what your blood pressure and pulse rate should be, and when you should contact them. Your care team also may schedule regular blood tests and electrocardiograms to check your progress. You may get drowsy or dizzy. Do not drive, use machinery, or do anything that needs mental alertness until you know how this medication affects you. Do not stand or sit up quickly, especially if you are an older patient. This reduces the risk of dizzy or fainting spells. Alcohol can make you more dizzy, increase flushing and rapid heartbeats. Avoid alcoholic drinks. What side effects may I notice from receiving this medication? Side effects that you should report to your care team as soon as possible: Allergic reactions--skin rash, itching, hives, swelling of the face, lips, tongue, or throat Heart failure--shortness of breath, swelling of the ankles, feet, or hands, sudden weight gain, unusual weakness or fatigue Heart rhythm changes--fast or irregular heartbeat, dizziness, feeling faint or  lightheaded, chest pain, trouble breathing Liver injury--right upper belly pain, loss of appetite, nausea, light-colored stool, dark yellow or brown urine, yellowing skin or eyes, unusual weakness or fatigue Side effects that usually do not require medical attention (report to your care team if they continue or are bothersome): Blurry vision Constipation Dizziness Fatigue Headache Nausea Tremors or shaking This list may not describe all possible side effects. Call your doctor for medical advice about side effects. You may report side effects to FDA at 1-800-FDA-1088. Where should I keep my medication? Keep out of the reach of children and pets. Store at room temperature between 15 and 30 degrees C (59 and 86 degrees F). Protect from light. Keep container tightly closed. Throw away any unused medication after the expiration date. NOTE: This sheet is a summary. It may not cover all possible information. If you have questions about this medicine, talk to your doctor, pharmacist, or health care provider.  2022 Elsevier/Gold Standard (2020-10-29 00:00:00)

## 2021-10-18 ENCOUNTER — Other Ambulatory Visit: Payer: Self-pay

## 2021-10-19 ENCOUNTER — Ambulatory Visit: Payer: 59 | Admitting: Cardiology

## 2021-12-02 DIAGNOSIS — F5101 Primary insomnia: Secondary | ICD-10-CM | POA: Insufficient documentation

## 2021-12-02 HISTORY — DX: Primary insomnia: F51.01

## 2022-01-09 ENCOUNTER — Encounter: Payer: Self-pay | Admitting: Cardiology

## 2022-01-09 ENCOUNTER — Ambulatory Visit (INDEPENDENT_AMBULATORY_CARE_PROVIDER_SITE_OTHER): Payer: 59 | Admitting: Cardiology

## 2022-01-09 VITALS — BP 116/72 | HR 87 | Ht 62.0 in | Wt 157.0 lb

## 2022-01-09 DIAGNOSIS — I493 Ventricular premature depolarization: Secondary | ICD-10-CM | POA: Diagnosis not present

## 2022-01-09 MED ORDER — FLECAINIDE ACETATE 100 MG PO TABS: 100.0000 mg | ORAL_TABLET | Freq: Two times a day (BID) | ORAL | 6 refills | Status: DC

## 2022-01-09 NOTE — Patient Instructions (Addendum)
Medication Instructions:  ?Your physician has recommended you make the following change in your medication:  ?INCREASE Flecainide to 100 mg twice daily ? ?*If you need a refill on your cardiac medications before your next appointment, please call your pharmacy* ? ? ?Lab Work: ?None ordered ? ? ?Testing/Procedures: ?None ordered ? ? ?Follow-Up: ?At [facility], you and your health needs are our priority.  As part of our continuing mission to provide you with exceptional heart care, we have created designated Provider Care Teams.  These Care Teams include your primary Cardiologist (physician) and Advanced Practice Providers (APPs -  Physician Assistants and Nurse Practitioners) who all work together to provide you with the care you need, when you need it. ? ?Your next appointment:   ?6 month(s) ? ?The format for your next appointment:   ?In Person ? ?Provider:   ?Loman Brooklyn, MD ? ? ? ?Thank you for choosing [facility]!! ? ? ?Dory Horn, RN ?([phone] ? ?

## 2022-01-09 NOTE — Progress Notes (Signed)
? ?Electrophysiology Office Note ? ? ?Date:  01/09/2022  ? ?ID:  Molly Small, DOB 1970-11-11, MRN [id number] ? ?PCP:  Jamse Mead, MD  ?Cardiologist:  Revankar ?Primary Electrophysiologist:  Oleta Gunnoe Jorja Loa, MD   ? ?Chief Complaint: PVC ?  ?History of Present Illness: ?Molly Small is a 51 y.o. female who is being seen today for the evaluation of PVC at the request of Jamse Mead, MD. Presenting today for electrophysiology evaluation. ? ?She has a history significant for metabolic syndrome, hyperlipidemia, obstructive sleep apnea, type 2 diabetes, morbid obesity status post bariatric surgery.  After her bariatric surgery she was able to stop her medications for both diabetes and hypertension.  She presented to cardiology clinic with palpitations and was noted to have a 14% PVC burden.  She was started on flecainide at her last visit. ? ?Today, denies symptoms of chest pain, shortness of breath, orthopnea, PND, lower extremity edema, claudication, dizziness, presyncope, syncope, bleeding, or neurologic sequela. The patient is tolerating medications without difficulties.  Since being seen, she has continued to have palpitations.  She has at times chest pain associated with her palpitations.  She has these on a daily basis.  By the end of the day she feels quite fatigued. ? ? ?Past Medical History:  ?Diagnosis Date  ? Acute cystitis without hematuria 07/06/2021  ? Last Assessment & Plan:  Formatting of this note might be different from the original. POCT UA in the office today reveals + nitrites and hematuria. Demareon Coldwell send UA for micro and reflex culture and treat with ABX as appropriate.  ? 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 Last Assessment & Plan:  Formatting of this note might be different from the original. Patient reports decreased clitoral sensation in decrease or gas mixed response.  We discussed options of compound medication which include constituents of nitroglycerin to improve blood flow.   [facility] in [place] provides 3 levels of compound cream.  The patient elects to begin with the m  ? Anxiety   ? Asthma   ? Atrophic vaginitis 07/06/2021  ? Last Assessment & Plan:  Formatting of this note might be different from the original. Discussed findings of atrophic vaginitis. The pathophysiology of this disease process and the effects upon the vaginal epithelium and urothelium were reviewed in detail.   Options of local vaginal estrogen cream/pills/oral SERM were discussed. Benefits include restoration of  normal vaginal pH and thickening of   ? Cardiac murmur 07/15/2021  ? Cervical radiculitis 05/03/2017  ? Chest pain of uncertain etiology 07/15/2021  ? Chronic midline low back pain without sciatica 06/28/2018  ? Formatting of this note might be different from the original. Last Assessment & Plan:  Formatting of this note might be different from the original. We discussed that at this time given she would not want any surgical intervention and does not have any worsening red flag symptoms, she would be unlikely to benefit from MRI. Eldrige Pitkin continue to work on weight loss to reduce weight on spine. Last Assess  ? Class 3 severe obesity due to excess calories with serious comorbidity and body mass index (BMI) of 45.0 to 49.9 in adult [facility]) 04/12/2018  ? Formatting of this note might be different from the original. Last Assessment & Plan:  Formatting of this note might be different from the original. BMI Assessment: Current Body mass index is 48.21 kg/m?Marland Kitchen  Patient BMI currently is above average (>25 kg/m2); BMI follow up plan is completed . Current barriers to  healthy weight management include none.  BMI Plan:  Today, Debora and I have discussed   ? COVID-19 virus infection 05/08/2020  ? Last Assessment & Plan:  Formatting of this note might be different from the original. Continues to be symptomatic with fatigue. Zion Ta provide work note for a few more days to see if she is able to return  to work.  ? Depression   ? Dermatitis 08/18/2021  ? Last Assessment & Plan:  Formatting of this note might be different from the original. Chronic worse. Likely due to recurrent irritation from skin due to weight loss. Recommend cortisone bid until rash improves.  ? Essential hypertension 04/28/2016  ? Formatting of this note might be different from the original. Last Assessment & Plan:  Formatting of this note might be different from the original. Chronic, stable. Continue current rx regimen. Last Assessment & Plan:  Formatting of this note might be different from the original. Chronic, stable. Controlled. Continue with current regimen.  ? 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 Gastroesophageal reflux disease 04/28/2016  ? Formatting of this note might be different from the original. Last Assessment & Plan:  Formatting of this note might be different from the original. Fu with GI for egd Last Assessment & Plan:  Formatting of this note might be different from the original. Fu with GI for egd  ? History of recurrent UTIs 07/06/2021  ? Last Assessment & Plan:  Formatting of this note might be different from the original. Patient reports longstanding history of UTIs. Discussed the importance of needing urine cultures when patient symptomatic with UTI since she is having recurrence of symptoms. She does have +nitrites in urine today, Ayan Heffington send for UA/culture today.  ? Hypokalemia 05/08/2020  ? Lymphadenopathy 10/17/2018  ? Last Assessment & Plan:  Formatting of this note might be different from the original. Improved on CT scan. Christena Sunderlin plan for repeat in 3 months  ? Metabolic syndrome X 04/28/2016  ? Mixed anxiety and depressive disorder 05/03/2017  ? Mixed hyperlipidemia 05/31/2020  ? Last Assessment & Plan:  Formatting of this note might be different from the original. Chronic, not controlled. Bryson Gavia stop lipitor and change to crestor. Repeat labs prior to next visit.  ? Moderate obstructive sleep apnea 08/04/2020  ? Moderate  persistent asthma without complication 12/05/2019  ? Last Assessment & Plan:  Formatting of this note might be different from the original. Chronic, stable. Controlled. Continue with current regimen.  ? Overactive bladder 07/06/2021  ? Last Assessment & Plan:  Formatting of this note might be different from the original. Given the patient's irritative voiding symptoms of urgency and/or frequency and/or nocturia, we Lareen Mullings proceed with urodynamic testing. The [employer] urodynamic testing fact sheet was provided to the patient after review in the office. Patient Elvert Cumpton return the office after investigative procedures to review her results   ? Overweight with body mass index (BMI) of 29 to 29.9 in adult 04/12/2018  ? Last Assessment & Plan:  Formatting of this note might be different from the original. BMI Assessment: Current Body mass index is 37 kg/m?Marland Kitchen  Patient BMI currently is above average (>25 kg/m2); BMI follow up plan is completed . Current barriers to healthy weight management include none.  BMI Plan:  Today, Shontay and I have discussed methods to help address her current weight.   congratulated on we  ? Palpitations 07/15/2021  ? Paresthesias 10/06/2021  ? Last Assessment & Plan:  Formatting of this note might be different from  the original. Recommend checking labs given she is s/p gastric bypass. Has not had these checked in about 6 months. MRI w/wo and referral to neurology input. She's aware of ER precautions.  ? Protein-calorie malnutrition (HCC) 10/15/2020  ? Right sided numbness 05/08/2020  ? Last Assessment & Plan:  Formatting of this note might be different from the original. Chronic, resolved. MRI reviewed with no evidence of stroke  ? S/P bariatric surgery 10/15/2020  ? Spasm of thoracic back muscle 01/18/2018  ? Last Assessment & Plan:  Formatting of this note might be different from the original. Continue with the robaxin at night to prevent muscle spasms. I have placed a referral for you to see Dr. Erline Levine.   ? Tinea corporis 08/02/2021  ? Last Assessment & Plan:  Formatting of this note might be different from the original. Several erythematous, mildly raised papules which are well demarcated with some scalin

## 2022-01-17 ENCOUNTER — Telehealth: Payer: Self-pay | Admitting: *Deleted

## 2022-01-17 ENCOUNTER — Encounter: Payer: Self-pay | Admitting: Cardiology

## 2022-01-17 NOTE — Telephone Encounter (Signed)
[place] office reports pt has been trying to get in touch with me. ?Called pt and Left message to call back ? ?

## 2022-01-19 ENCOUNTER — Encounter: Payer: Self-pay | Admitting: Cardiology

## 2022-01-24 ENCOUNTER — Encounter: Payer: Self-pay | Admitting: Cardiology

## 2022-02-02 ENCOUNTER — Encounter: Payer: Self-pay | Admitting: Cardiology

## 2022-02-10 ENCOUNTER — Telehealth: Payer: Self-pay

## 2022-02-10 ENCOUNTER — Other Ambulatory Visit: Payer: Self-pay

## 2022-02-10 DIAGNOSIS — R002 Palpitations: Secondary | ICD-10-CM

## 2022-02-10 DIAGNOSIS — I493 Ventricular premature depolarization: Secondary | ICD-10-CM

## 2022-02-10 NOTE — Telephone Encounter (Signed)
I called pt and went over PVC ablation instructions. She is scheduled for 02/17/22 @ 10:30. Letter with instructions has been sent to pt via MyChart.

## 2022-02-14 ENCOUNTER — Other Ambulatory Visit: Payer: Self-pay | Admitting: *Deleted

## 2022-02-14 ENCOUNTER — Other Ambulatory Visit: Payer: 59

## 2022-02-14 DIAGNOSIS — R002 Palpitations: Secondary | ICD-10-CM

## 2022-02-14 DIAGNOSIS — I493 Ventricular premature depolarization: Secondary | ICD-10-CM

## 2022-02-14 NOTE — Telephone Encounter (Signed)
Pt advised to hold Flecainide after tonight's dose, per Dr. Elberta Fortis. Patient verbalized understanding and agreeable to plan.

## 2022-02-15 LAB — BASIC METABOLIC PANEL
BUN/Creatinine Ratio: 25 — ABNORMAL HIGH (ref 9–23)
BUN: 20 mg/dL (ref 6–24)
CO2: 25 mmol/L (ref 20–29)
Calcium: 8.9 mg/dL (ref 8.7–10.2)
Chloride: 108 mmol/L — ABNORMAL HIGH (ref 96–106)
Creatinine, Ser: 0.81 mg/dL (ref 0.57–1.00)
Glucose: 70 mg/dL (ref 70–99)
Potassium: 4.3 mmol/L (ref 3.5–5.2)
Sodium: 145 mmol/L — ABNORMAL HIGH (ref 134–144)
eGFR: 88 mL/min/{1.73_m2} (ref >59)

## 2022-02-15 LAB — CBC
Hematocrit: 40.9 % (ref 34.0–46.6)
Hemoglobin: 13.6 g/dL (ref 11.1–15.9)
MCH: 30.1 pg (ref 26.6–33.0)
MCHC: 33.3 g/dL (ref 31.5–35.7)
MCV: 91 fL (ref 79–97)
Platelets: 249 10*3/uL (ref 150–450)
RBC: 4.52 x10E6/uL (ref 3.77–5.28)
RDW: 12 % (ref 11.7–15.4)
WBC: 5.5 10*3/uL (ref 3.4–10.8)

## 2022-02-17 ENCOUNTER — Encounter (HOSPITAL_COMMUNITY)
Admission: RE | Disposition: A | Discharge status: Home / Self Care | Payer: Self-pay | Source: Home / Self Care | Attending: Cardiology

## 2022-02-17 ENCOUNTER — Encounter (HOSPITAL_COMMUNITY)
Admission: RE | Disposition: A | Discharge status: Home / Self Care | Payer: 59 | Source: Home / Self Care | Attending: Cardiology

## 2022-02-17 ENCOUNTER — Ambulatory Visit (HOSPITAL_COMMUNITY): Payer: 59 | Admitting: Anesthesiology

## 2022-02-17 ENCOUNTER — Observation Stay (HOSPITAL_COMMUNITY)
Admission: RE | Admit: 2022-02-17 | Discharge: 2022-02-19 | Disposition: A | Discharge status: Home / Self Care | Payer: 59 | Attending: Cardiology | Admitting: Cardiology

## 2022-02-17 ENCOUNTER — Other Ambulatory Visit: Payer: Self-pay

## 2022-02-17 ENCOUNTER — Encounter (HOSPITAL_COMMUNITY): Payer: Self-pay | Admitting: Cardiology

## 2022-02-17 ENCOUNTER — Ambulatory Visit (HOSPITAL_COMMUNITY): Payer: 59

## 2022-02-17 ENCOUNTER — Ambulatory Visit (HOSPITAL_BASED_OUTPATIENT_CLINIC_OR_DEPARTMENT_OTHER): Payer: 59 | Admitting: Anesthesiology

## 2022-02-17 ENCOUNTER — Ambulatory Visit (HOSPITAL_BASED_OUTPATIENT_CLINIC_OR_DEPARTMENT_OTHER): Payer: 59

## 2022-02-17 DIAGNOSIS — I200 Unstable angina: Secondary | ICD-10-CM | POA: Diagnosis not present

## 2022-02-17 DIAGNOSIS — I10 Essential (primary) hypertension: Secondary | ICD-10-CM

## 2022-02-17 DIAGNOSIS — F418 Other specified anxiety disorders: Secondary | ICD-10-CM

## 2022-02-17 DIAGNOSIS — I493 Ventricular premature depolarization: Secondary | ICD-10-CM

## 2022-02-17 DIAGNOSIS — R079 Chest pain, unspecified: Secondary | ICD-10-CM

## 2022-02-17 DIAGNOSIS — E119 Type 2 diabetes mellitus without complications: Secondary | ICD-10-CM

## 2022-02-17 DIAGNOSIS — J45909 Unspecified asthma, uncomplicated: Secondary | ICD-10-CM | POA: Insufficient documentation

## 2022-02-17 DIAGNOSIS — Z8616 Personal history of COVID-19: Secondary | ICD-10-CM | POA: Insufficient documentation

## 2022-02-17 HISTORY — DX: Ventricular premature depolarization: I49.3

## 2022-02-17 HISTORY — PX: LEFT HEART CATH AND CORONARY ANGIOGRAPHY: CATH118249

## 2022-02-17 HISTORY — PX: PVC ABLATION: EP1236

## 2022-02-17 LAB — ECHOCARDIOGRAM COMPLETE
Area-P 1/2: 2.46 cm2
Calc EF: 36.7 %
Height: 62 in
Single Plane A2C EF: 34.4 %
Single Plane A4C EF: 37.3 %
Weight: 2480 oz

## 2022-02-17 LAB — POCT ACTIVATED CLOTTING TIME: Activated Clotting Time: 221 seconds

## 2022-02-17 SURGERY — PVC ABLATION
Anesthesia: General

## 2022-02-17 SURGERY — LEFT HEART CATH AND CORONARY ANGIOGRAPHY
Anesthesia: LOCAL

## 2022-02-17 MED ORDER — OXYCODONE-ACETAMINOPHEN 5-325 MG PO TABS
ORAL_TABLET | ORAL | Status: AC
  Filled 2022-02-17: qty 1

## 2022-02-17 MED ORDER — HEPARIN (PORCINE) IN NACL 1000-0.9 UT/500ML-% IV SOLN
INTRAVENOUS | Status: AC
  Filled 2022-02-17: qty 1000

## 2022-02-17 MED ORDER — ONDANSETRON HCL 4 MG/2ML IJ SOLN
INTRAMUSCULAR | Status: DC | PRN
  Administered 2022-02-17: 4 mg via INTRAVENOUS

## 2022-02-17 MED ORDER — SODIUM CHLORIDE 0.9% FLUSH: 3.0000 mL | INTRAVENOUS | Status: DC | PRN

## 2022-02-17 MED ORDER — LABETALOL HCL 5 MG/ML IV SOLN: 10.0000 mg | INTRAVENOUS | Status: AC | PRN

## 2022-02-17 MED ORDER — ACETAMINOPHEN 325 MG PO TABS
650.0000 mg | ORAL_TABLET | ORAL | Status: DC | PRN
  Administered 2022-02-17 – 2022-02-18 (×2): 650 mg via ORAL
  Filled 2022-02-17: qty 2

## 2022-02-17 MED ORDER — IOHEXOL 350 MG/ML SOLN
INTRAVENOUS | Status: DC | PRN
  Administered 2022-02-17: 65 mg via INTRA_ARTERIAL

## 2022-02-17 MED ORDER — LIDOCAINE HCL (PF) 1 % IJ SOLN
INTRAMUSCULAR | Status: DC | PRN
  Administered 2022-02-17: 2 mL via INTRADERMAL

## 2022-02-17 MED ORDER — ASPIRIN 81 MG PO TBEC
81.0000 mg | DELAYED_RELEASE_TABLET | Freq: Every day | ORAL | Status: DC
  Administered 2022-02-18 – 2022-02-19 (×2): 81 mg via ORAL
  Filled 2022-02-17 (×2): qty 1

## 2022-02-17 MED ORDER — CYCLOBENZAPRINE HCL 10 MG PO TABS
5.0000 mg | ORAL_TABLET | Freq: Three times a day (TID) | ORAL | Status: DC | PRN
  Administered 2022-02-17 – 2022-02-18 (×2): 5 mg via ORAL
  Filled 2022-02-17 (×2): qty 1

## 2022-02-17 MED ORDER — HEPARIN (PORCINE) IN NACL 1000-0.9 UT/500ML-% IV SOLN
INTRAVENOUS | Status: DC | PRN
  Administered 2022-02-17 (×3): 500 mL

## 2022-02-17 MED ORDER — BUPIVACAINE HCL (PF) 0.25 % IJ SOLN
INTRAMUSCULAR | Status: AC
  Filled 2022-02-17: qty 30

## 2022-02-17 MED ORDER — OXYCODONE-ACETAMINOPHEN 5-325 MG PO TABS
1.0000 | ORAL_TABLET | Freq: Once | ORAL | Status: AC
  Administered 2022-02-17: 1 via ORAL

## 2022-02-17 MED ORDER — FENTANYL CITRATE (PF) 100 MCG/2ML IJ SOLN
INTRAMUSCULAR | Status: AC
  Filled 2022-02-17: qty 2

## 2022-02-17 MED ORDER — VERAPAMIL HCL 2.5 MG/ML IV SOLN
INTRAVENOUS | Status: AC
  Filled 2022-02-17: qty 2

## 2022-02-17 MED ORDER — SODIUM CHLORIDE 0.9% FLUSH
3.0000 mL | Freq: Two times a day (BID) | INTRAVENOUS | Status: DC
  Administered 2022-02-17 – 2022-02-18 (×3): 3 mL via INTRAVENOUS

## 2022-02-17 MED ORDER — PANTOPRAZOLE SODIUM 40 MG PO TBEC: 40.0000 mg | DELAYED_RELEASE_TABLET | ORAL | Status: DC | PRN

## 2022-02-17 MED ORDER — VERAPAMIL HCL 2.5 MG/ML IV SOLN
INTRAVENOUS | Status: DC | PRN
  Administered 2022-02-17: 10 mL via INTRA_ARTERIAL

## 2022-02-17 MED ORDER — HYDROMORPHONE HCL 1 MG/ML IJ SOLN
1.0000 mg | Freq: Once | INTRAMUSCULAR | Status: AC
  Administered 2022-02-17: 1 mg via INTRAVENOUS
  Filled 2022-02-17: qty 1

## 2022-02-17 MED ORDER — PROPOFOL 500 MG/50ML IV EMUL
INTRAVENOUS | Status: DC | PRN
Start: 2022-02-17 — End: 2022-02-17
  Administered 2022-02-17: 100 ug/kg/min via INTRAVENOUS

## 2022-02-17 MED ORDER — HEPARIN SODIUM (PORCINE) 1000 UNIT/ML IJ SOLN
INTRAMUSCULAR | Status: AC
  Filled 2022-02-17: qty 10

## 2022-02-17 MED ORDER — HYDRALAZINE HCL 20 MG/ML IJ SOLN: 10.0000 mg | INTRAMUSCULAR | Status: AC | PRN

## 2022-02-17 MED ORDER — ISOPROTERENOL HCL 0.2 MG/ML IJ SOLN
INTRAMUSCULAR | Status: AC
  Filled 2022-02-17: qty 5

## 2022-02-17 MED ORDER — MIDAZOLAM HCL 2 MG/2ML IJ SOLN
INTRAMUSCULAR | Status: AC
  Filled 2022-02-17: qty 2

## 2022-02-17 MED ORDER — SODIUM CHLORIDE 0.9% FLUSH: 3.0000 mL | Freq: Two times a day (BID) | INTRAVENOUS | Status: DC

## 2022-02-17 MED ORDER — ONDANSETRON HCL 4 MG/2ML IJ SOLN: 4.0000 mg | Freq: Four times a day (QID) | INTRAMUSCULAR | Status: DC | PRN

## 2022-02-17 MED ORDER — PROPOFOL 10 MG/ML IV BOLUS
INTRAVENOUS | Status: DC | PRN
Start: 2022-02-17 — End: 2022-02-17
  Administered 2022-02-17: 20 mg via INTRAVENOUS
  Administered 2022-02-17: 10 mg via INTRAVENOUS

## 2022-02-17 MED ORDER — ISOPROTERENOL HCL 0.2 MG/ML IJ SOLN
INTRAVENOUS | Status: DC | PRN
  Administered 2022-02-17: 4 ug/min via INTRAVENOUS

## 2022-02-17 MED ORDER — HEPARIN SODIUM (PORCINE) 1000 UNIT/ML IJ SOLN
INTRAMUSCULAR | Status: DC | PRN
  Administered 2022-02-17: 5000 [IU] via INTRAVENOUS

## 2022-02-17 MED ORDER — SODIUM CHLORIDE 0.9 % IV SOLN
250.0000 mL | INTRAVENOUS | Status: DC | PRN
Start: 2022-02-17 — End: 2022-02-19

## 2022-02-17 MED ORDER — HEPARIN SODIUM (PORCINE) 1000 UNIT/ML IJ SOLN
INTRAMUSCULAR | Status: DC | PRN
  Administered 2022-02-17: 1000 [IU] via INTRAVENOUS

## 2022-02-17 MED ORDER — ACETAMINOPHEN 500 MG PO TABS
1000.0000 mg | ORAL_TABLET | Freq: Once | ORAL | Status: AC
  Administered 2022-02-17: 1000 mg via ORAL
  Filled 2022-02-17: qty 2

## 2022-02-17 MED ORDER — MIDAZOLAM HCL 5 MG/5ML IJ SOLN
INTRAMUSCULAR | Status: DC | PRN
  Administered 2022-02-17: 2 mg via INTRAVENOUS

## 2022-02-17 MED ORDER — FENTANYL CITRATE (PF) 100 MCG/2ML IJ SOLN
INTRAMUSCULAR | Status: DC | PRN
  Administered 2022-02-17: 25 ug via INTRAVENOUS

## 2022-02-17 MED ORDER — IOHEXOL 350 MG/ML SOLN
100.0000 mL | Freq: Once | INTRAVENOUS | Status: AC | PRN
  Administered 2022-02-17: 100 mL via INTRAVENOUS

## 2022-02-17 MED ORDER — ACETAMINOPHEN 325 MG PO TABS
ORAL_TABLET | ORAL | Status: AC
  Filled 2022-02-17: qty 2

## 2022-02-17 MED ORDER — FENTANYL CITRATE (PF) 100 MCG/2ML IJ SOLN
INTRAMUSCULAR | Status: DC | PRN
  Administered 2022-02-17 (×4): 50 ug via INTRAVENOUS

## 2022-02-17 MED ORDER — SODIUM CHLORIDE 0.9 % IV SOLN: 250.0000 mL | INTRAVENOUS | Status: DC | PRN

## 2022-02-17 MED ORDER — VENLAFAXINE HCL ER 75 MG PO CP24
75.0000 mg | ORAL_CAPSULE | Freq: Every day | ORAL | Status: DC
  Administered 2022-02-17 – 2022-02-19 (×3): 75 mg via ORAL
  Filled 2022-02-17 (×3): qty 1

## 2022-02-17 MED ORDER — LORAZEPAM 1 MG PO TABS
1.0000 mg | ORAL_TABLET | Freq: Four times a day (QID) | ORAL | Status: DC | PRN
Start: 2022-02-17 — End: 2022-02-19
  Administered 2022-02-17: 1 mg via ORAL
  Filled 2022-02-17: qty 1

## 2022-02-17 MED ORDER — LIDOCAINE HCL (PF) 1 % IJ SOLN
INTRAMUSCULAR | Status: AC
  Filled 2022-02-17: qty 30

## 2022-02-17 MED ORDER — LORATADINE 10 MG PO TABS: 10.0000 mg | ORAL_TABLET | Freq: Every day | ORAL | Status: DC | PRN

## 2022-02-17 MED ORDER — SCOPOLAMINE 1 MG/3DAYS TD PT72
1.0000 | MEDICATED_PATCH | TRANSDERMAL | Status: DC
  Administered 2022-02-17: 1.5 mg via TRANSDERMAL
  Filled 2022-02-17: qty 1

## 2022-02-17 MED ORDER — HEPARIN (PORCINE) IN NACL 1000-0.9 UT/500ML-% IV SOLN
INTRAVENOUS | Status: DC | PRN
  Administered 2022-02-17 (×2): 500 mL via INTRA_ARTERIAL

## 2022-02-17 MED ORDER — BUPIVACAINE HCL (PF) 0.25 % IJ SOLN
INTRAMUSCULAR | Status: DC | PRN
  Administered 2022-02-17: 30 mL

## 2022-02-17 MED ORDER — SODIUM CHLORIDE 0.9 % IV SOLN: INTRAVENOUS | Status: DC

## 2022-02-17 MED ORDER — MIDAZOLAM HCL 2 MG/2ML IJ SOLN
INTRAMUSCULAR | Status: DC | PRN
  Administered 2022-02-17: 1 mg via INTRAVENOUS

## 2022-02-17 MED ORDER — SODIUM CHLORIDE 0.9% FLUSH
3.0000 mL | Freq: Two times a day (BID) | INTRAVENOUS | Status: DC
  Administered 2022-02-19: 3 mL via INTRAVENOUS

## 2022-02-17 MED ORDER — SODIUM CHLORIDE 0.9 % IV SOLN: INTRAVENOUS | Status: AC

## 2022-02-17 SURGICAL SUPPLY — 13 items
CATH 8FR REPROCESSED SOUNDSTAR (CATHETERS) ×2 IMPLANT
CATH 8FR SOUNDSTAR REPROCESSED (CATHETERS) IMPLANT
CATH JOSEPH QUAD ALLRED 6F REP (CATHETERS) ×1 IMPLANT
CATH SMTCH THERMOCOOL SF DF (CATHETERS) ×1 IMPLANT
CLOSURE PERCLOSE PROSTYLE (VASCULAR PRODUCTS) ×4 IMPLANT
PACK EP LATEX FREE (CUSTOM PROCEDURE TRAY) ×1
PACK EP LF (CUSTOM PROCEDURE TRAY) ×1 IMPLANT
PAD DEFIB RADIO PHYSIO CONN (PAD) ×2 IMPLANT
PATCH CARTO3 (PAD) ×1 IMPLANT
SHEATH PINNACLE 7F 10CM (SHEATH) ×1 IMPLANT
SHEATH PINNACLE 8F 10CM (SHEATH) ×2 IMPLANT
SHEATH PINNACLE 9F 10CM (SHEATH) ×1 IMPLANT
TUBING SMART ABLATE COOLFLOW (TUBING) ×1 IMPLANT

## 2022-02-17 SURGICAL SUPPLY — 15 items
CATH DIAG 6FR JR4 (CATHETERS) ×1 IMPLANT
CATH DIAG 6FR PIGTAIL ANGLED (CATHETERS) ×1 IMPLANT
CATH INFINITI 6F FL3.5 (CATHETERS) ×1 IMPLANT
GLIDESHEATH SLEND SS 6F .021 (SHEATH) ×1 IMPLANT
GUIDEWIRE INQWIRE 1.5J.035X260 (WIRE) IMPLANT
INQWIRE 1.5J .035X260CM (WIRE) ×2
KIT HEART LEFT (KITS) ×2 IMPLANT
PACK CARDIAC CATHETERIZATION (CUSTOM PROCEDURE TRAY) ×2 IMPLANT
STOPCOCK MORSE 400PSI 3WAY (MISCELLANEOUS) ×1 IMPLANT
SYR MEDRAD MARK 7 150ML (SYRINGE) ×2 IMPLANT
SYR MEDRAD MARK V 150ML (SYRINGE) ×1 IMPLANT
TRANSDUCER W/STOPCOCK (MISCELLANEOUS) ×2 IMPLANT
TUBING CIL FLEX 10 FLL-RA (TUBING) ×2 IMPLANT
TUBING CONTRAST HIGH PRESS 20 (MISCELLANEOUS) ×1 IMPLANT
TUBING CONTRAST HIGH PRESS 48 (TUBING) ×1 IMPLANT

## 2022-02-17 NOTE — Transfer of Care (Signed)
Immediate Anesthesia Transfer of Care Note  Patient: Molly Small  Procedure(s) Performed: PVC ABLATION  Patient Location: Cath Lab  Anesthesia Type:MAC  Level of Consciousness: awake, alert  and oriented  Airway & Oxygen Therapy: Patient Spontanous Breathing and Patient connected to nasal cannula oxygen  Post-op Assessment: Report given to RN and Post -op Vital signs reviewed and stable  Post vital signs: Reviewed and stable  Last Vitals:  Vitals Value Taken Time  BP 121/77 02/17/22 1150  Temp    Pulse 81 02/17/22 1153  Resp 15 02/17/22 1153  SpO2 100 % 02/17/22 1153  Vitals shown include unvalidated device data.  Last Pain:  Vitals:   02/17/22 0852  TempSrc: Oral         Complications: There were no known notable events for this encounter.

## 2022-02-17 NOTE — Progress Notes (Signed)
Patient presented to the hospital with PVCs for PVC ablation.  During the procedure, PVCs were mapped to the left ventricle around the His bundle location.  During the procedure, she developed a left bundle branch block, likely due to catheter pressure on the left bundle.  No ablation was performed.  Post procedure, the patient complained of chest pain that radiated to her back.  CTA showed no evidence of dissection.  Transthoracic echo showed a wall motion abnormality consistent with left bundle branch block.  Due to her left bundle branch block and wall motion abnormality, and inability to tell if QT segments changed, patient is planned for left heart catheterization today.

## 2022-02-17 NOTE — Anesthesia Preprocedure Evaluation (Addendum)
Anesthesia Evaluation  Patient identified by MRN, date of birth, ID band Patient awake    Reviewed: Allergy & Precautions, NPO status , Patient's Chart, lab work & pertinent test results  Airway Mallampati: II  TM Distance: >3 FB Neck ROM: Full    Dental no notable dental hx.    Pulmonary asthma , sleep apnea ,    Pulmonary exam normal breath sounds clear to auscultation       Cardiovascular hypertension, Pt. on medications Normal cardiovascular exam+ dysrhythmias (on flecainide) Ventricular Tachycardia  Rhythm:Regular Rate:Normal  ECHO 2022: Left ventricular ejection fraction, by estimation, is 55 to 60%. The left ventricle has normal function. The left ventricle has no regional wall motion abnormalities. Left ventricular diastolic parameters were normal. 1. 2. Left atrial size was moderately dilated.   Neuro/Psych PSYCHIATRIC DISORDERS Anxiety Depression  Neuromuscular disease (cervical radiculitis)    GI/Hepatic Neg liver ROS, GERD  ,S/p gastric bypass   Endo/Other  diabetes, Type 2  Renal/GU negative Renal ROS  negative genitourinary   Musculoskeletal negative musculoskeletal ROS (+)   Abdominal   Peds negative pediatric ROS (+)  Hematology negative hematology ROS (+)   Anesthesia Other Findings   Reproductive/Obstetrics negative OB ROS                             Anesthesia Physical Anesthesia Plan  ASA: 3  Anesthesia Plan: MAC   Post-op Pain Management:    Induction: Intravenous  PONV Risk Score and Plan: 3 and Scopolamine patch - Pre-op, Treatment may vary due to age or medical condition, Midazolam, Dexamethasone, Ondansetron, Propofol infusion and TIVA  Airway Management Planned: Oral ETT  Additional Equipment: None  Intra-op Plan:   Post-operative Plan: Extubation in OR  Informed Consent: I have reviewed the patients History and Physical, chart, labs and  discussed the procedure including the risks, benefits and alternatives for the proposed anesthesia with the patient or authorized representative who has indicated his/her understanding and acceptance.     Dental advisory given  Plan Discussed with: CRNA, Anesthesiologist and Surgeon  Anesthesia Plan Comments:        Anesthesia Quick Evaluation

## 2022-02-17 NOTE — Anesthesia Postprocedure Evaluation (Signed)
Anesthesia Post Note  Patient: Molly Small  Procedure(s) Performed: PVC ABLATION     Patient location during evaluation: PACU Anesthesia Type: General Level of consciousness: awake Pain management: pain level controlled Vital Signs Assessment: post-procedure vital signs reviewed and stable Respiratory status: spontaneous breathing and respiratory function stable Cardiovascular status: stable Postop Assessment: no apparent nausea or vomiting Anesthetic complications: no   There were no known notable events for this encounter.  Last Vitals:  Vitals:   02/17/22 1225 02/17/22 1227  BP: 133/71 133/71  Pulse: (!) 25 (!) 138  Resp: (!) 31 17  Temp:    SpO2: 99% 100%    Last Pain:  Vitals:   02/17/22 1153  TempSrc:   PainSc: 10-Worst pain ever                 Mellody Dance

## 2022-02-17 NOTE — Interval H&P Note (Signed)
History and Physical Interval Note:  02/17/2022 2:55 PM  Molly Small  has presented today for surgery, with the diagnosis of wall motion abnormality.  The various methods of treatment have been discussed with the patient and family. After consideration of risks, benefits and other options for treatment, the patient has consented to  Procedure(s): LEFT HEART CATH AND CORONARY ANGIOGRAPHY (N/A) as a surgical intervention.  The patient's history has been reviewed, patient examined, no change in status, stable for surgery.  I have reviewed the patient's chart and labs.  Questions were answered to the patient's satisfaction.     Orbie Pyo

## 2022-02-17 NOTE — Plan of Care (Signed)
  Problem: Education: Goal: Understanding of CV disease, CV risk reduction, and recovery process will improve Outcome: Progressing   Problem: Activity: Goal: Ability to return to baseline activity level will improve Outcome: Progressing   Problem: Cardiovascular: Goal: Ability to achieve and maintain adequate cardiovascular perfusion will improve Outcome: Progressing   Problem: Cardiovascular: Goal: Vascular access site(s) Level 0-1 will be maintained Outcome: Progressing   Problem: Pain Managment: Goal: General experience of comfort will improve Outcome: Progressing

## 2022-02-17 NOTE — H&P (Signed)
Electrophysiology Office Note   Date:  02/17/2022   ID:  Molly Small, DOB [birth year], MRN [id number]  PCP:  Julian Reil, MD  Cardiologist:  Revankar Primary Electrophysiologist:  Bennie Scaff Meredith Leeds, MD    Chief Complaint: PVC   History of Present Illness: Molly Small is a 51 y.o. female who is being seen today for the evaluation of PVC at the request of No ref. provider found. Presenting today for electrophysiology evaluation.  She has a history significant for metabolic syndrome, hyperlipidemia, obstructive sleep apnea, type 2 diabetes, morbid obesity status post bariatric surgery.  After her bariatric surgery she was able to stop her medications for both diabetes and hypertension.  She presented to cardiology clinic with palpitations and was noted to have a 14% PVC burden.  She was started on flecainide at her last visit.  Today, denies symptoms of palpitations, chest pain, shortness of breath, orthopnea, PND, lower extremity edema, claudication, dizziness, presyncope, syncope, bleeding, or neurologic sequela. The patient is tolerating medications without difficulties. Plan pvc ablation today.    Past Medical History:  Diagnosis Date   Acute cystitis without hematuria 07/06/2021   Last Assessment & Plan:  Formatting of this note might be different from the original. POCT UA in the office today reveals + nitrites and hematuria. Molly Small send UA for micro and reflex culture and treat with ABX as appropriate.   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 Patient reports decreased clitoral sensation in decrease or gas mixed response.  We discussed options of compound medication which include constituents of nitroglycerin to improve blood flow.  [facility] in [place] provides 3 levels of compound cream.  The patient elects to begin with the m   Anxiety    Asthma    Atrophic  vaginitis 07/06/2021   Last Assessment & Plan:  Formatting of this note might be different from the original. Discussed findings of atrophic vaginitis. The pathophysiology of this disease process and the effects upon the vaginal epithelium and urothelium were reviewed in detail.   Options of local vaginal estrogen cream/pills/oral SERM were discussed. Benefits include restoration of  normal vaginal pH and thickening of    Cardiac murmur 07/15/2021   Cervical radiculitis 05/03/2017   Chest pain of uncertain etiology [id number]   Chronic midline low back pain without sciatica 06/28/2018   Formatting of this note might be different from the original. Last Assessment & Plan:  Formatting of this note might be different from the original. We discussed that at this time given she would not want any surgical intervention and does not have any worsening red flag symptoms, she would be unlikely to benefit from MRI. Molly Small continue to work on weight loss to reduce weight on spine. Last Assess   Class 3 severe obesity due to excess calories with serious comorbidity and body mass index (BMI) of 45.0 to 49.9 in adult [facility]) 04/12/2018   Formatting of this note might be different from the original. Last Assessment & Plan:  Formatting of this note might be different from the original. BMI Assessment: Current Body mass index is 48.21 kg/m.  Patient BMI currently is above average (>25 kg/m2); BMI follow up plan is completed . Current barriers to healthy weight management include none.  BMI Plan:  Today, Maquita and I have discussed    COVID-19 virus infection 05/08/2020   Last Assessment & Plan:  Formatting of this note  might be different from the original. Continues to be symptomatic with fatigue. Molly Small provide work note for a few more days to see if she is able to return to work.   Depression    Dermatitis 08/18/2021   Last Assessment & Plan:  Formatting of this note might be different from the original. Chronic worse.  Likely due to recurrent irritation from skin due to weight loss. Recommend cortisone bid until rash improves.   Essential hypertension 04/28/2016   Formatting of this note might be different from the original. Last Assessment & Plan:  Formatting of this note might be different from the original. Chronic, stable. Continue current rx regimen. Last Assessment & Plan:  Formatting of this note might be different from the original. Chronic, stable. Controlled. Continue with current regimen.   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 Last Assessment & Plan:  Formatting of this note might be different from the original. Fu with GI for egd Last Assessment & Plan:  Formatting of this note might be different from the original. Fu with GI for egd   History of recurrent UTIs 07/06/2021   Last Assessment & Plan:  Formatting of this note might be different from the original. Patient reports longstanding history of UTIs. Discussed the importance of needing urine cultures when patient symptomatic with UTI since she is having recurrence of symptoms. She does have +nitrites in urine today, Molly Small send for UA/culture today.   Hypokalemia 05/08/2020   Lymphadenopathy 10/17/2018   Last Assessment & Plan:  Formatting of this note might be different from the original. Improved on CT scan. Molly Small plan for repeat in 3 months   Metabolic syndrome X [id number]   Mixed anxiety and depressive disorder 05/03/2017   Mixed hyperlipidemia 05/31/2020   Last Assessment & Plan:  Formatting of this note might be different from the original. Chronic, not controlled. Copeland Neisen stop lipitor and change to crestor. Repeat labs prior to next visit.   Moderate obstructive sleep apnea 08/04/2020   Moderate persistent asthma without complication [id number]   Last Assessment & Plan:  Formatting of this note might be different from the original. Chronic, stable.  Controlled. Continue with current regimen.   Overactive bladder 07/06/2021   Last Assessment & Plan:  Formatting of this note might be different from the original. Given the patient's irritative voiding symptoms of urgency and/or frequency and/or nocturia, we Molly Small proceed with urodynamic testing. The [employer] urodynamic testing fact sheet was provided to the patient after review in the office. Patient Molly Small return the office after investigative procedures to review her results    Overweight with body mass index (BMI) of 29 to 29.9 in adult 04/12/2018   Last Assessment & Plan:  Formatting of this note might be different from the original. BMI Assessment: Current Body mass index is 37 kg/m.  Patient BMI currently is above average (>25 kg/m2); BMI follow up plan is completed . Current barriers to healthy weight management include none.  BMI Plan:  Today, Molly Small and I have discussed methods to help address her current weight.   congratulated on we   Palpitations 07/15/2021   Paresthesias 10/06/2021   Last Assessment & Plan:  Formatting of this note might be different from the original. Recommend checking labs given she is s/p gastric bypass. Has not had these checked in about 6 months. MRI w/wo and referral to neurology input. She's aware of ER precautions.  Protein-calorie malnutrition (HCC) 10/15/2020   Right sided numbness 05/08/2020   Last Assessment & Plan:  Formatting of this note might be different from the original. Chronic, resolved. MRI reviewed with no evidence of stroke   S/P bariatric surgery 10/15/2020   Spasm of thoracic back muscle 01/18/2018   Last Assessment & Plan:  Formatting of this note might be different from the original. Continue with the robaxin at night to prevent muscle spasms. I have placed a referral for you to see Dr. Erline Levine.   Tinea corporis 08/02/2021   Last Assessment & Plan:  Formatting of this note might be different from the original. Several erythematous, mildly raised  papules which are well demarcated with some scaling and central clearing on bilateral forearms and anterior chest wall   She was seen at urgent care for this - started on PO antifungal - has taken 2 weeks of it (2 doses) No improvement  Molly Grabel do topical antifungal daily - conti   Type 2 diabetes mellitus without complication, without long-term current use of insulin (HCC) 04/28/2016   Formatting of this note might be different from the original. Last Assessment & Plan:  Formatting of this note is different from the original. Pertinent Data:   Current medication includes: Ozempic pen injector - 1 mg/dose (2 mg/1.5 mL).  Hemoglobin A1C (%)  Date Value  10/31/2019 6.3 (H)  07/11/2019 6.6 (H)   Microalb/Creat Ratio (mg/g)  Date Value  07/11/2019 5   Creatinine (mg/dL)  Date Value     Urgency incontinence 07/06/2021   Past Surgical History:  Procedure Laterality Date   CHOLECYSTECTOMY       Current Facility-Administered Medications  Medication Dose Route Frequency Provider Last Rate Last Admin   0.9 %  sodium chloride infusion   Intravenous Continuous Regan Lemming, MD 50 mL/hr at 02/17/22 0947 New Bag at 02/17/22 0947   scopolamine (TRANSDERM-SCOP) 1 MG/3DAYS 1.5 mg  1 patch Transdermal Q72H Mellody Dance, MD   1.5 mg at 02/17/22 6468    Allergies:   Penicillins, Fexofenadine, Atorvastatin, and Bupropion   Social History:  The patient  reports that she has never smoked. She has never used smokeless tobacco. She reports that she does not drink alcohol and does not use drugs.   Family History:  The patient's family history includes Hyperlipidemia in her brother, father, and mother; Hypertension in her brother, father, and mother.   ROS:  Please see the history of present illness.   Otherwise, review of systems is positive for none.   All other systems are reviewed and negative.   PHYSICAL EXAM: VS:  BP 127/74   Pulse (!) 47   Temp 97.6 F (36.4 C) (Oral)   Resp 18   Ht 5\' 2"  (1.575 m)    Wt 70.3 kg   SpO2 100%   BMI 28.35 kg/m  , BMI Body mass index is 28.35 kg/m. GEN: Well nourished, well developed, in no acute distress  HEENT: normal  Neck: no JVD, carotid bruits, or masses Cardiac: RRR; no murmurs, rubs, or gallops,no edema  Respiratory:  clear to auscultation bilaterally, normal work of breathing GI: soft, nontender, nondistended, + BS MS: no deformity or atrophy  Skin: warm and dry Neuro:  Strength and sensation are intact Psych: euthymic mood, full affect   Recent Labs: 02/14/2022: BUN 20; Creatinine, Ser 0.81; Hemoglobin 13.6; Platelets 249; Potassium 4.3; Sodium 145    Lipid Panel  No results found for: CHOL, TRIG, HDL, CHOLHDL, VLDL,  Quay, LDLDIRECT   Wt Readings from Last 3 Encounters:  02/17/22 70.3 kg  01/09/22 71.2 kg  10/10/21 69.4 kg      Other studies Reviewed: Additional studies/ records that were reviewed today include: TTE 06/19/21  Review of the above records today demonstrates:   1. Left ventricular ejection fraction, by estimation, is 55 to 60%. The  left ventricle has normal function. The left ventricle has no regional  wall motion abnormalities. Left ventricular diastolic parameters were  normal.   2. Left atrial size was moderately dilated.   Myoview 07/19/21   Findings are consistent withprior ischemia and no prior myocardial infarction. The study is low risk.   No ST deviation was noted.   Left ventricular function is abnormal. Global function is mildly reduced. Nuclear stress EF: 48 %. The left ventricular ejection fraction is mildly decreased (45-54%). End diastolic cavity size is normal.   Prior study not available for comparison.   Good exercise tolerance - 16min 30 sec of Bruce protocol, negative stress test for ischemia. Noted mildly reduced EF. Consider echocardiogram to confirm.  Cardiac monitor 08/03/21 Predominant underlying rhythm was sinus rhythm 14.1% PVC burden  ASSESSMENT AND PLAN:  1.  PVCs: Molly Small has presented today for surgery, with the diagnosis of PVC.  The various methods of treatment have been discussed with the patient and family. After consideration of risks, benefits and other options for treatment, the patient has consented to  Procedure(s): Catheter ablation as a surgical intervention .  Risks include but not limited to complete heart block, stroke, esophageal damage, nerve damage, bleeding, vascular damage, tamponade, perforation, MI, and death. The patient's history has been reviewed, patient examined, no change in status, stable for surgery.  I have reviewed the patient's chart and labs.  Questions were answered to the patient's satisfaction.    Molly Huntley Curt Bears, MD 02/17/2022 9:52 AM

## 2022-02-18 DIAGNOSIS — J45909 Unspecified asthma, uncomplicated: Secondary | ICD-10-CM | POA: Diagnosis not present

## 2022-02-18 DIAGNOSIS — I10 Essential (primary) hypertension: Secondary | ICD-10-CM | POA: Diagnosis not present

## 2022-02-18 DIAGNOSIS — E119 Type 2 diabetes mellitus without complications: Secondary | ICD-10-CM | POA: Diagnosis not present

## 2022-02-18 DIAGNOSIS — I493 Ventricular premature depolarization: Secondary | ICD-10-CM

## 2022-02-18 LAB — TROPONIN I (HIGH SENSITIVITY)
Troponin I (High Sensitivity): 57 ng/L — ABNORMAL HIGH (ref <18)
Troponin I (High Sensitivity): 50 ng/L — ABNORMAL HIGH (ref <18)

## 2022-02-18 NOTE — Progress Notes (Signed)
Progress Note  Patient Name: Molly Small Date of Encounter: 02/18/2022  [facility] Cardiologist: None   Subjective   NAEO. Still having 6/10 chest pain centrally located radiating to the back.  Inpatient Medications    Scheduled Meds:  aspirin EC  81 mg Oral Daily   sodium chloride flush  3 mL Intravenous Q12H   sodium chloride flush  3 mL Intravenous Q12H   sodium chloride flush  3 mL Intravenous Q12H   venlafaxine XR  75 mg Oral Daily   Continuous Infusions:  sodium chloride     sodium chloride     PRN Meds: sodium chloride, sodium chloride, acetaminophen, cyclobenzaprine, loratadine, LORazepam, ondansetron (ZOFRAN) IV, sodium chloride flush, sodium chloride flush   Vital Signs    Vitals:   02/17/22 2031 02/18/22 0017 02/18/22 0457 02/18/22 0858  BP: (!) 100/59 103/74 113/64 109/63  Pulse: 67 62 (!) 57 66  Resp: Temp: 98.1 F (36.7 C) 98.4 F (36.9 C) 98.3 F (36.8 C) 98.5 F (36.9 C)  TempSrc: Oral Oral Axillary Oral  SpO2: 96% 91% 97%   Weight:      Height:        Intake/Output Summary (Last 24 hours) at 02/18/2022 0943 Last data filed at 02/17/2022 2027 Gross per 24 hour  Intake 838.75 ml  Output 1200 ml  Net -361.25 ml      02/17/2022    8:52 AM 01/09/2022    9:55 AM 10/10/2021    9:28 AM  Last 3 Weights  Weight (lbs) 155 lb 157 lb 153 lb  Weight (kg) 70.308 kg 71.215 kg 69.4 kg      Telemetry    Sinus with PVCs. Personally Reviewed  ECG    Personally Reviewed  Physical Exam   GEN: No acute distress.   Neck: No JVD Cardiac: RRR, no murmurs, rubs, or gallops.  Respiratory: Clear to auscultation bilaterally. GI: Soft, nontender, non-distended  Molly: No edema; No deformity. Neuro:  Nonfocal  Psych: Normal affect   Labs    High Sensitivity Troponin:  No results for input(s): TROPONINIHS in the last 720 hours.   Chemistry Recent Labs  Lab 02/14/22 0822  NA 145*  K 4.3  CL 108*  CO2 25  GLUCOSE 70  BUN 20   CREATININE 0.81  CALCIUM 8.9    Lipids No results for input(s): CHOL, TRIG, HDL, LABVLDL, LDLCALC, CHOLHDL in the last 168 hours.  Hematology Recent Labs  Lab 02/14/22 0822  WBC 5.5  RBC 4.52  HGB 13.6  HCT 40.9  MCV 91  MCH 30.1  MCHC 33.3  RDW 12.0  PLT 249   Thyroid No results for input(s): TSH, FREET4 in the last 168 hours.  BNPNo results for input(s): BNP, PROBNP in the last 168 hours.  DDimer No results for input(s): DDIMER in the last 168 hours.   Radiology    CARDIAC CATHETERIZATION  Result Date: 02/17/2022   LV end diastolic pressure is normal.   The left ventricular ejection fraction is 35-45% by visual estimate. 1.  Normal right dominant system coronary circulation. 2.  LVEDP of 15 mmHg with ventriculography-demonstrating ejection fraction approximately 30 to 45% with no evidence of Takotsubo cardiomyopathy. Recommendation: Evaluate for noncardiac chest pain.   EP STUDY  Result Date: 02/17/2022 SURGEON: Loman Brooklyn, MD PREPROCEDURE DIAGNOSIS: PVCs POSTPROCEDURE DIAGNOSIS: PVCs PROCEDURES: 1. Comprehensive EP study. 2. Coronary sinus pacing and recording. 3.  Three-dimensional mapping of PVCs. 4. Arrhythmia induction with isuprel  infused INTRODUCTION: Molly Small is a 51 y.o. female with a history of symptomatic r PVCs who presents today for EP study and radiofrequency ablation. The patient has had recurrent symptomatic PVCs.  She now presents for EP study and radiofrequency ablation of PVCs. DESCRIPTION OF PROCEDURE: Informed written consent was obtained and the patient was brought to the Electrophysiology Lab in the fasting state. The patient was adequately sedated with intravenous medication as outlined in the anesthesia report. The patient's right groin was prepped and draped in the usual sterile fashion by the EP Lab staff. Using a percutaneous Seldinger technique, one 8 [place] and one 9 French hemostasis and one 7 [place] hemostasis sheath were placed in the right  femoral vein.  One 8 [place] hemostasis sheath was placed in the right femoral artery.  A 6 French Josephson quadripolar catheter was placed through the right femoral vein and into the right ventricle.   Direct ultrasound guidance is used for right femoral artery and vein with normal vessel patency. Ultrasound images are captured and stored in the patient's chart. Using ultrasound guidance, the Brockenbrough needle and wire were visualized entering the vessel. A Biosense Webster sound star 8 French catheter was placed into the right ventricle through the 9 [place] hemostasis sheath.  3D sound mapping was performed of the right ventricle, right ventricular outflow tract, left ventricular outflow tract. Presenting Measurements: The patient presented to the Electrophysiology Lab in sinus rhythm. The PR interval was 148 msec with a QRS of 102 msec and a Qt of 372 msec. The average RR interval was 668 msec. Three-dimensional mapping: Mapping of PVCs were performed initially in the right ventricle.  A Biosense Webster ST SF DF catheter was placed into the right ventricle.  Mapping of this area showed no early signals.  The signal in the right ventricular outflow tract was late compared to the QRS complex.  Due to that, the ablation catheter was placed through the right femoral artery and into the left ventricle.  Mapping around the right and left coronary cusps revealed no early signals.  The ablation catheter was thus prolapsed into the left ventricle.  Mapping inferior to the right coronary cusp revealed early signals.  Signals were 20 Molly early compared to the clinical PVC.  Throughout this time, there was a high burden of ectopy.  The patient also developed a right bundle branch block, likely due to catheter manipulation in the left ventricle.  Pacing was performed in the left ventricle in the area of the earliest signal.  Pace mapping showed a 75% match in this area, though there was quite a bit of ectopy and possible  fusion beats.  When the patient was having sinus beats in the area of the early signal, there was an obvious hiss on both the proximal and distal poles of the ablation catheter.  Due to proximity close to the His bundle, no ablation was performed. Measurements following EP study: Following EP study, the patient's PR interval was 98 Molly, QRS 162 Molly, QT 411 Molly, RR 675 Molly.  The AH measured 90 Molly, HV 53 Molly.  AV Wenckebach was 320 Molly. CONCLUSIONS: 1. Sinus rhythm upon presentation. 2.  The patient had a high burden of PVCs with isoproterenol infusion at 4 mcg/kg /min 3.  No ablation performed due to proximity of earliest signal to the His bundle 4.  No early apparent complications.   CT ANGIO CHEST AORTA W/CM & OR WO/CM  Result Date: 02/17/2022 CLINICAL DATA:  Chest pain  EXAM: CT ANGIOGRAPHY CHEST WITH CONTRAST TECHNIQUE: Multidetector CT imaging of the chest was performed using the standard protocol during bolus administration of intravenous contrast. Multiplanar CT image reconstructions and MIPs were obtained to evaluate the vascular anatomy. Noncontrast images of chest were also obtained. RADIATION DOSE REDUCTION: This exam was performed according to the departmental dose-optimization program which includes automated exposure control, adjustment of the mA and/or kV according to patient size and/or use of iterative reconstruction technique. CONTRAST:  OMNIPAQUE IOHEXOL 350 MG/ML SOLN COMPARISON:  None Available. FINDINGS: Cardiovascular: There is no evidence of mural hematoma in thoracic aorta in the noncontrast images. There is homogeneous enhancement in thoracic aorta in the postcontrast images. There is no demonstrable intimal flap. Scattered coronary artery calcifications are seen. Major branches of thoracic aorta appear patent. There are no filling defects in the pulmonary artery branches. Mediastinum/Nodes: There is no evidence of mediastinal hematoma. There is no significant lymphadenopathy.  Lungs/Pleura: There is no focal pulmonary consolidation. Small linear densities in the posterior costophrenic angles may suggest minimal scarring or subsegmental atelectasis. There is no pleural effusion or pneumothorax. Upper Abdomen: There is fatty infiltration in the liver. Surgical clips are seen in gallbladder fossa. Surgical staples are seen in the stomach. Musculoskeletal: Unremarkable. Review of the MIP images confirms the above findings. IMPRESSION: There is no evidence of thoracic aortic dissection. There is no evidence pulmonary artery embolism. Scattered coronary artery calcifications are seen. Small linear densities in the posterior costophrenic angles on both sides may suggest minimal scarring or subsegmental atelectasis. There is no focal pulmonary consolidation. There is no pleural effusion or pneumothorax. Fatty liver. Electronically Signed   By: Ernie Avena M.D.   On: 02/17/2022 13:44   ECHOCARDIOGRAM COMPLETE  Result Date: 02/17/2022    ECHOCARDIOGRAM REPORT   Patient Name:   Molly Small Date of Exam: 02/17/2022 Medical Rec #:  [id number]     Height:       62.0 in Accession #:    [id number]    Weight:       155.0 lb Date of Birth:  [birth year]    BSA:          1.715 m Patient Age:    50 years      BP:           134/71 mmHg Patient Gender: F             HR:           74 bpm. Exam Location:  Inpatient Procedure: 2D Echo, Color Doppler and Cardiac Doppler STAT ECHO Indications:    Chest Pain  History:        Patient has prior history of Echocardiogram examinations.  Sonographer:    Irving Burton Senior RDCS Referring Phys: [id number] WILL MARTIN CAMNITZ IMPRESSIONS  1. Apex is akinetic. Left ventricular ejection fraction, by estimation, is 35 to 40%. The left ventricle has moderately decreased function. The left ventricle demonstrates global hypokinesis. Indeterminate diastolic filling due to E-A fusion.  2. Right ventricular systolic function is normal. The right ventricular size is normal. Tricuspid  regurgitation signal is inadequate for assessing PA pressure.  3. Left atrial size was mildly dilated.  4. The mitral valve is normal in structure. No evidence of mitral valve regurgitation.  5. The aortic valve is grossly normal. Aortic valve regurgitation is not visualized.  6. Aortic no signficant aortic root aneurysm. Conclusion(s)/Recommendation(s): Comparing images from the prior study EF not significantly different. FINDINGS  Left Ventricle: Apex is akinetic.  Left ventricular ejection fraction, by estimation, is 35 to 40%. The left ventricle has moderately decreased function. The left ventricle demonstrates global hypokinesis. The left ventricular internal cavity size was normal in size. There is no left ventricular hypertrophy. Abnormal (paradoxical) septal motion, consistent with left bundle branch block. Indeterminate diastolic filling due to E-A fusion. Right Ventricle: The right ventricular size is normal. Right ventricular systolic function is normal. Tricuspid regurgitation signal is inadequate for assessing PA pressure. Left Atrium: Left atrial size was mildly dilated. Right Atrium: Right atrial size was normal in size. Pericardium: There is no evidence of pericardial effusion. Mitral Valve: The mitral valve is normal in structure. No evidence of mitral valve regurgitation. Tricuspid Valve: The tricuspid valve is normal in structure. Tricuspid valve regurgitation is not demonstrated. Aortic Valve: The aortic valve is grossly normal. Aortic valve regurgitation is not visualized. Pulmonic Valve: Pulmonic valve regurgitation is not visualized. Aorta: No signficant aortic root aneurysm.  LEFT VENTRICLE PLAX 2D LVOT diam:     2.00 cm      Diastology LV SV:         34           LV e' medial:    6.09 cm/s LV SV Index:   20           LV E/e' medial:  7.6 LVOT Area:     3.14 cm     LV e' lateral:   9.57 cm/s                             LV E/e' lateral: 4.8  LV Volumes (MOD) LV vol d, MOD A2C: 116.0 ml LV vol  d, MOD A4C: 106.0 ml LV vol s, MOD A2C: 76.1 ml LV vol s, MOD A4C: 66.5 ml LV SV MOD A2C:     39.9 ml LV SV MOD A4C:     106.0 ml LV SV MOD BP:      41.7 ml RIGHT VENTRICLE TAPSE (M-mode): 1.8 cm LEFT ATRIUM             Index        RIGHT ATRIUM           Index LA Vol (A2C):   49.7 ml 28.97 ml/m  RA Area:     12.50 cm LA Vol (A4C):   61.9 ml 36.08 ml/m  RA Volume:   27.60 ml  16.09 ml/m LA Biplane Vol: 57.1 ml 33.29 ml/m  AORTIC VALVE LVOT Vmax:   63.30 cm/s LVOT Vmean:  45.000 cm/s LVOT VTI:    0.109 m  AORTA Ao Root diam: 3.10 cm MITRAL VALVE MV Area (PHT): 2.46 cm    SHUNTS MV Decel Time: 308 msec    Systemic VTI:  0.11 m MV E velocity: 46.30 cm/s  Systemic Diam: 2.00 cm MV A velocity: 87.40 cm/s MV E/A ratio:  0.53 [place] signed by Carolan Clines Signature Date/Time: 02/17/2022/2:52:26 PM    Final       Assessment & Plan    Molly Small is a 51yo woman with PVCs who was admitted with chest pain after an aborted PVC ablation. During mappign the PVC localized to adjacent to the His bundle precluding safe ablation. After, she had chest pain. CTA aorta negative for dissection and echo with new WMA and decreased EF. LHC performed which showed no significant coronary disease.  #Chest pain Unclear cause. Still moderate in intensity (6/10). Plan  to observe another 24 hours to confirm clinical stability. Will check a troponin to risk stratify active pain.  #PVCs  For questions or updates, please contact [facility] Please consult [email or web address] for contact info under        Signed, Lanier Prude, MD  02/18/2022, 9:43 AM

## 2022-02-18 NOTE — Progress Notes (Signed)
Patient still complaining of mild chest discomfort radiating to back this morning similar to yesterday, not worsened. She is very sleepy this morning. Mild murmur noted on left upper sternal border.

## 2022-02-19 ENCOUNTER — Encounter: Payer: Self-pay | Admitting: Student

## 2022-02-19 DIAGNOSIS — I493 Ventricular premature depolarization: Secondary | ICD-10-CM | POA: Diagnosis not present

## 2022-02-19 MED ORDER — FLECAINIDE ACETATE 50 MG PO TABS
50.0000 mg | ORAL_TABLET | Freq: Two times a day (BID) | ORAL | Status: DC
Start: 2022-02-19 — End: 2022-02-19
  Administered 2022-02-19: 50 mg via ORAL
  Filled 2022-02-19: qty 1

## 2022-02-19 MED ORDER — FLECAINIDE ACETATE 50 MG PO TABS
50.0000 mg | ORAL_TABLET | Freq: Two times a day (BID) | ORAL | 1 refills | Status: DC
Start: 2022-02-19 — End: 2023-02-23

## 2022-02-19 NOTE — Discharge Summary (Signed)
Discharge Summary    Patient ID: Molly Small MRN: [id number]; DOB: 1971-01-18  Admit date: 02/17/2022 Discharge date: 02/19/2022  PCP:  Julian Reil, [facility] Providers Cardiologist:  Jenean Lindau, MD  Electrophysiologist:  Constance Haw, MD       Discharge Diagnoses    Principal Problem:   PVC's (premature ventricular contractions) Active Problems:   Chest pain of uncertain etiology   Diagnostic Studies/Procedures    PVC Ablation: 02/17/2022 Presenting Measurements: The patient presented to the Electrophysiology Lab in sinus rhythm. The PR interval was 148 msec with a QRS of 102 msec and a Qt of 372 msec. The average RR interval was 668 msec.    Three-dimensional mapping:  Mapping of PVCs were performed initially in the right ventricle.  A [employer] Webster ST SF DF catheter was placed into the right ventricle.  Mapping of this area showed no early signals.  The signal in the right ventricular outflow tract was late compared to the QRS complex.  Due to that, the ablation catheter was placed through the right femoral artery and into the left ventricle.  Mapping around the right and left coronary cusps revealed no early signals.  The ablation catheter was thus prolapsed into the left ventricle.  Mapping inferior to the right coronary cusp revealed early signals.  Signals were 20 ms early compared to the clinical PVC.  Throughout this time, there was a high burden of ectopy.  The patient also developed a right bundle branch block, likely due to catheter manipulation in the left ventricle.  Pacing was performed in the left ventricle in the area of the earliest signal.  Pace mapping showed a 75% match in this area, though there was quite a bit of ectopy and possible fusion beats.  When the patient was having sinus beats in the area of the early signal, there was an obvious hiss on both the proximal and distal poles of the ablation catheter.  Due to proximity close  to the His bundle, no ablation was performed.     Measurements following EP study:  Following EP study, the patient's PR interval was 98 ms, QRS 162 ms, QT 411 ms, RR 675 ms.  The AH measured 90 ms, HV 53 ms.  AV Wenckebach was 320 ms.   CONCLUSIONS:  1. Sinus rhythm upon presentation.  2.  The patient had a high burden of PVCs with isoproterenol infusion at 4 mcg/kg /min 3.  No ablation performed due to proximity of earliest signal to the His bundle 4.  No early apparent complications.   Echocardiogram: 02/17/2022 IMPRESSIONS     1. Apex is akinetic. Left ventricular ejection fraction, by estimation,  is 35 to 40%. The left ventricle has moderately decreased function. The  left ventricle demonstrates global hypokinesis. Indeterminate diastolic  filling due to E-A fusion.   2. Right ventricular systolic function is normal. The right ventricular  size is normal. Tricuspid regurgitation signal is inadequate for assessing  PA pressure.   3. Left atrial size was mildly dilated.   4. The mitral valve is normal in structure. No evidence of mitral valve  regurgitation.   5. The aortic valve is grossly normal. Aortic valve regurgitation is not  visualized.   6. Aortic no signficant aortic root aneurysm.   LHC: [id number]   LV end diastolic pressure is normal.   The left ventricular ejection fraction is 35-45% by visual estimate.   1.  Normal right dominant system coronary circulation. 2.  LVEDP of 15 mmHg with ventriculography-demonstrating ejection fraction approximately 30 to 45% with no evidence of Takotsubo cardiomyopathy.   Recommendation: Evaluate for noncardiac chest pain.    History of Present Illness     Molly Small is a 51 y.o. female with past medical history of HLD, Type 2 DM, OSA and frequent PVC's who presented to [facility] on 02/17/2022 for planned PVC Ablation.   She was referred to Dr. Curt Bears as an outpatient given her PVC burden of 14% by outpatient monitor.  She had been started on Flecainide 50mg  BID and this was increased to 100mg  BID in 12/2021 but she continued to have frequent symptoms, therefore PVC ablation was recommended.   Hospital Course     Consultants: None   During her PVC mapping, she was noted to have a high burden of PVC's but ablation was not performed given proximity of the earliest signal to the His bundle. During the procedure, she developed a LBBB and reported significant chest pain which radiated into her back. CTA showed no evidence of thoracic aortic but was noted to have scattered coronary artery calcification. Echo was performed and showed a reduced EF of 35-40% with the apex being akinetic and noted to have global HK. RV function was normal with no significant valve abnormalities. She ultimately underwent a cardiac catheterization which showed normal coronary arteries and EF at 30-45%.   The following day, she reported still having chest pain and Hs Troponin values were obtained and flat at 57 and 50. On 02/19/2022, she reported much improvement in her chest pain. She was examined by Dr. Quentin Ore and deemed stable for discharge. Was recommended to restart Flecainide at 50mg  BID. She has previously scheduled follow-up with EP and a staff message has been sent to the [place] office to arrange for General Cardiology follow-up as she will need additional medical management of her cardiomyopathy (BP soft at 98/57 on discharge which limits medical titration but can perhaps consider an SGLT2 inhibitor as an outpatient).     Did the patient have an acute coronary syndrome (MI, NSTEMI, STEMI, etc) this admission?:  No                               Did the patient have a percutaneous coronary intervention (stent / angioplasty)?:  No.          _____________  Discharge Vitals Blood pressure (!) 98/57, pulse (!) 50, temperature 98.1 F (36.7 C), temperature source Axillary, resp. rate 16, height 5\' 2"  (1.575 m), weight 70.3 kg, SpO2 97  %.  Filed Weights   02/17/22 0852  Weight: 70.3 kg    Labs & Radiologic Studies    CBC No results for input(s): WBC, NEUTROABS, HGB, HCT, MCV, PLT in the last 72 hours. Basic Metabolic Panel No results for input(s): NA, K, CL, CO2, GLUCOSE, BUN, CREATININE, CALCIUM, MG, PHOS in the last 72 hours. Liver Function Tests No results for input(s): AST, ALT, ALKPHOS, BILITOT, PROT, ALBUMIN in the last 72 hours. No results for input(s): LIPASE, AMYLASE in the last 72 hours. High Sensitivity Troponin:   Recent Labs  Lab 02/18/22 1023 02/18/22 1229  TROPONINIHS 57* 50*    BNP Invalid input(s): POCBNP D-Dimer No results for input(s): DDIMER in the last 72 hours. Hemoglobin A1C No results for input(s): HGBA1C in the last 72 hours. Fasting Lipid Panel No results for input(s): CHOL, HDL, LDLCALC, TRIG, CHOLHDL, LDLDIRECT in the last  72 hours. Thyroid Function Tests No results for input(s): TSH, T4TOTAL, T3FREE, THYROIDAB in the last 72 hours.  Invalid input(s): FREET3 _____________   CT ANGIO CHEST AORTA W/CM & OR WO/CM  Result Date: 02/17/2022 CLINICAL DATA:  Chest pain EXAM: CT ANGIOGRAPHY CHEST WITH CONTRAST TECHNIQUE: Multidetector CT imaging of the chest was performed using the standard protocol during bolus administration of intravenous contrast. Multiplanar CT image reconstructions and MIPs were obtained to evaluate the vascular anatomy. Noncontrast images of chest were also obtained. RADIATION DOSE REDUCTION: This exam was performed according to the departmental dose-optimization program which includes automated exposure control, adjustment of the mA and/or kV according to patient size and/or use of iterative reconstruction technique. CONTRAST:  122mL OMNIPAQUE IOHEXOL 350 MG/ML SOLN COMPARISON:  None Available. FINDINGS: Cardiovascular: There is no evidence of mural hematoma in thoracic aorta in the noncontrast images. There is homogeneous enhancement in thoracic aorta in the  postcontrast images. There is no demonstrable intimal flap. Scattered coronary artery calcifications are seen. Major branches of thoracic aorta appear patent. There are no filling defects in the pulmonary artery branches. Mediastinum/Nodes: There is no evidence of mediastinal hematoma. There is no significant lymphadenopathy. Lungs/Pleura: There is no focal pulmonary consolidation. Small linear densities in the posterior costophrenic angles may suggest minimal scarring or subsegmental atelectasis. There is no pleural effusion or pneumothorax. Upper Abdomen: There is fatty infiltration in the liver. Surgical clips are seen in gallbladder fossa. Surgical staples are seen in the stomach. Musculoskeletal: Unremarkable. Review of the MIP images confirms the above findings. IMPRESSION: There is no evidence of thoracic aortic dissection. There is no evidence pulmonary artery embolism. Scattered coronary artery calcifications are seen. Small linear densities in the posterior costophrenic angles on both sides may suggest minimal scarring or subsegmental atelectasis. There is no focal pulmonary consolidation. There is no pleural effusion or pneumothorax. Fatty liver. Electronically Signed   By: Elmer Picker M.D.   On: 02/17/2022 13:44   ECHOCARDIOGRAM COMPLETE  Result Date: 02/17/2022    ECHOCARDIOGRAM REPORT   Patient Name:   DELVINA WEAKS Date of Exam: 02/17/2022 Medical Rec #:  [id number]     Height:       62.0 in Accession #:    [id number]    Weight:       155.0 lb Date of Birth:  12/11/1970    BSA:          1.715 m Patient Age:    24 years      BP:           134/71 mmHg Patient Gender: F             HR:           74 bpm. Exam Location:  Inpatient Procedure: 2D Echo, Color Doppler and Cardiac Doppler STAT ECHO Indications:    Chest Pain  History:        Patient has prior history of Echocardiogram examinations.  Sonographer:    Raquel Sarna Senior RDCS Referring Phys: [id number] WILL MARTIN [place]  1. Apex is  akinetic. Left ventricular ejection fraction, by estimation, is 35 to 40%. The left ventricle has moderately decreased function. The left ventricle demonstrates global hypokinesis. Indeterminate diastolic filling due to E-A fusion.  2. Right ventricular systolic function is normal. The right ventricular size is normal. Tricuspid regurgitation signal is inadequate for assessing PA pressure.  3. Left atrial size was mildly dilated.  4. The mitral valve is normal in structure. No evidence of mitral  valve regurgitation.  5. The aortic valve is grossly normal. Aortic valve regurgitation is not visualized.  6. Aortic no signficant aortic root aneurysm. Conclusion(s)/Recommendation(s): Comparing images from the prior study EF not significantly different. FINDINGS  Left Ventricle: Apex is akinetic. Left ventricular ejection fraction, by estimation, is 35 to 40%. The left ventricle has moderately decreased function. The left ventricle demonstrates global hypokinesis. The left ventricular internal cavity size was normal in size. There is no left ventricular hypertrophy. Abnormal (paradoxical) septal motion, consistent with left bundle branch block. Indeterminate diastolic filling due to E-A fusion. Right Ventricle: The right ventricular size is normal. Right ventricular systolic function is normal. Tricuspid regurgitation signal is inadequate for assessing PA pressure. Left Atrium: Left atrial size was mildly dilated. Right Atrium: Right atrial size was normal in size. Pericardium: There is no evidence of pericardial effusion. Mitral Valve: The mitral valve is normal in structure. No evidence of mitral valve regurgitation. Tricuspid Valve: The tricuspid valve is normal in structure. Tricuspid valve regurgitation is not demonstrated. Aortic Valve: The aortic valve is grossly normal. Aortic valve regurgitation is not visualized. Pulmonic Valve: Pulmonic valve regurgitation is not visualized. Aorta: No signficant aortic root  aneurysm.  LEFT VENTRICLE PLAX 2D LVOT diam:     2.00 cm      Diastology LV SV:         34           LV e' medial:    6.09 cm/s LV SV Index:   20           LV E/e' medial:  7.6 LVOT Area:     3.14 cm     LV e' lateral:   9.57 cm/s                             LV E/e' lateral: 4.8  LV Volumes (MOD) LV vol d, MOD A2C: 116.0 ml LV vol d, MOD A4C: 106.0 ml LV vol s, MOD A2C: 76.1 ml LV vol s, MOD A4C: 66.5 ml LV SV MOD A2C:     39.9 ml LV SV MOD A4C:     106.0 ml LV SV MOD BP:      41.7 ml RIGHT VENTRICLE TAPSE (M-mode): 1.8 cm LEFT ATRIUM             Index        RIGHT ATRIUM           Index LA Vol (A2C):   49.7 ml 28.97 ml/m  RA Area:     12.50 cm LA Vol (A4C):   61.9 ml 36.08 ml/m  RA Volume:   27.60 ml  16.09 ml/m LA Biplane Vol: 57.1 ml 33.29 ml/m  AORTIC VALVE LVOT Vmax:   63.30 cm/s LVOT Vmean:  45.000 cm/s LVOT VTI:    0.109 m  AORTA Ao Root diam: 3.10 cm MITRAL VALVE MV Area (PHT): 2.46 cm    SHUNTS MV Decel Time: 308 msec    Systemic VTI:  0.11 m MV E velocity: 46.30 cm/s  Systemic Diam: 2.00 cm MV A velocity: 87.40 cm/s MV E/A ratio:  0.53 Landscape architect signed by Phineas Inches Signature Date/Time: 02/17/2022/2:52:26 PM    Final     Disposition   Pt is being discharged home today in good condition.  Follow-up Plans & Appointments     Follow-up Information     Constance Haw, MD Follow up on 03/27/2022.  Specialty: Cardiology Why: Keep scheduled follow-up for 03/27/2022 at 3:30 PM. Contact information: [place] [address] [phone]         Revankar, Reita Cliche, MD Follow up.   Specialty: Cardiology Why: The office should call you within 1-2 business days to arrange follow-up. If you do not hear from them, please call the number provided. Contact information: [place] [address] [phone]                Discharge Instructions     Discharge instructions   Complete by: As directed    PLEASE REMEMBER TO BRING  ALL OF YOUR MEDICATIONS TO EACH OF YOUR FOLLOW-UP OFFICE VISITS.  PLEASE ATTEND ALL SCHEDULED FOLLOW-UP APPOINTMENTS.   Activity: Increase activity slowly as tolerated. You may shower, but no soaking baths (or swimming) for 1 week. No driving for 24 hours. No lifting over 5 lbs for 1 week. No sexual activity for 1 week.   You May Return to Work: in 1 week (if applicable)  Wound Care: You may wash cath site gently with soap and water. Keep cath site clean and dry. If you notice pain, swelling, bleeding or pus at your cath site, please call [phone].       Discharge Medications   Allergies as of 02/19/2022       Reactions   Penicillins Anaphylaxis, Hives, Itching, Nausea Only, Palpitations, Shortness Of Breath   Fexofenadine Swelling, Other (See Comments)   Numbness in face    Atorvastatin    Muscle pain / cramps    Bupropion    Confusion (intolerance)        Medication List     STOP taking these medications    ketoconazole 2 % cream Commonly known as: NIZORAL       TAKE these medications    aspirin EC 81 MG tablet Take 81 mg by mouth daily. Swallow whole.   BIOTIN PO Take 1 capsule by mouth daily.   COLLAGEN PO Take 1 Dose by mouth daily. Powder   flecainide 50 MG tablet Commonly known as: TAMBOCOR Take 1 tablet (50 mg total) by mouth 2 (two) times daily. What changed: Another medication with the same name was removed. Continue taking this medication, and follow the directions you see here.   levocetirizine 5 MG tablet Commonly known as: XYZAL Take 5 mg by mouth daily as needed for allergies.   nystatin cream Commonly known as: MYCOSTATIN Apply 1 application. topically 2 (two) times daily as needed (rash).   OVER THE COUNTER MEDICATION Take 1 capsule by mouth daily. Bariatric vitamin   pantoprazole 40 MG tablet Commonly known as: PROTONIX Take 40 mg by mouth as needed (reflux).   venlafaxine XR 37.5 MG 24 hr capsule Commonly known as:  EFFEXOR-XR Take 75 mg by mouth daily.   VITAMIN A PO Take 1 capsule by mouth daily.   VITAMIN D PO Take 1 capsule by mouth daily.   VITAMIN E PO Take 1 capsule by mouth daily.           Outstanding Labs/Studies   None  Duration of Discharge Encounter   Greater than 30 minutes including physician time.  Signed, Erma Heritage, PA-C 02/19/2022, 8:35 AM

## 2022-02-19 NOTE — Progress Notes (Signed)
Progress Note  Patient Name: Molly Small Date of Encounter: 02/19/2022  [facility] Cardiologist: None   Subjective   NAEO.  Chest pain much improved this morning. Inpatient Medications    Scheduled Meds:  aspirin EC  81 mg Oral Daily   sodium chloride flush  3 mL Intravenous Q12H   sodium chloride flush  3 mL Intravenous Q12H   sodium chloride flush  3 mL Intravenous Q12H   venlafaxine XR  75 mg Oral Daily   Continuous Infusions:  sodium chloride     sodium chloride     PRN Meds: sodium chloride, sodium chloride, acetaminophen, cyclobenzaprine, loratadine, LORazepam, ondansetron (ZOFRAN) IV, sodium chloride flush, sodium chloride flush   Vital Signs    Vitals:   02/18/22 1611 02/18/22 2020 02/19/22 0014 02/19/22 0427  BP: 122/67 116/67 (!) 103/56 (!) 98/57  Pulse:  62 (!) 49 (!) 50  Resp: 18 16 16 16   Temp: 98.1 F (36.7 C) 98.6 F (37 C) 98.2 F (36.8 C) 98.1 F (36.7 C)  TempSrc: Oral Oral Oral Axillary  SpO2:      Weight:      Height:        Intake/Output Summary (Last 24 hours) at 02/19/2022 0735 Last data filed at 02/18/2022 1837 Gross per 24 hour  Intake 400 ml  Output --  Net 400 ml       02/17/2022    8:52 AM 01/09/2022    9:55 AM 10/10/2021    9:28 AM  Last 3 Weights  Weight (lbs) 155 lb 157 lb 153 lb  Weight (kg) 70.308 kg 71.215 kg 69.4 kg      Telemetry    Sinus with PVCs. Personally Reviewed  ECG    Personally Reviewed  Physical Exam   GEN: No acute distress.   Neck: No JVD Cardiac: RRR, no murmurs, rubs, or gallops.  Respiratory: Clear to auscultation bilaterally. GI: Soft, nontender, non-distended  Molly: No edema; No deformity. Neuro:  Nonfocal  Psych: Normal affect   Labs    High Sensitivity Troponin:   Recent Labs  Lab 02/18/22 1023 02/18/22 1229  TROPONINIHS 57* 50*     Chemistry Recent Labs  Lab 02/14/22 0822  NA 145*  K 4.3  CL 108*  CO2 25  GLUCOSE 70  BUN 20  CREATININE 0.81  CALCIUM 8.9      Lipids No results for input(s): CHOL, TRIG, HDL, LABVLDL, LDLCALC, CHOLHDL in the last 168 hours.  Hematology Recent Labs  Lab 02/14/22 0822  WBC 5.5  RBC 4.52  HGB 13.6  HCT 40.9  MCV 91  MCH 30.1  MCHC 33.3  RDW 12.0  PLT 249    Thyroid No results for input(s): TSH, FREET4 in the last 168 hours.  BNPNo results for input(s): BNP, PROBNP in the last 168 hours.  DDimer No results for input(s): DDIMER in the last 168 hours.   Radiology    CARDIAC CATHETERIZATION  Result Date: [id number]   LV end diastolic pressure is normal.   The left ventricular ejection fraction is 35-45% by visual estimate. 1.  Normal right dominant system coronary circulation. 2.  LVEDP of 15 mmHg with ventriculography-demonstrating ejection fraction approximately 30 to 45% with no evidence of Takotsubo cardiomyopathy. Recommendation: Evaluate for noncardiac chest pain.   EP STUDY  Result Date: 02/17/2022 SURGEON: Allegra Lai, MD PREPROCEDURE DIAGNOSIS: PVCs POSTPROCEDURE DIAGNOSIS: PVCs PROCEDURES: 1. Comprehensive EP study. 2. Coronary sinus pacing and recording. 3.  Three-dimensional mapping of PVCs. 4.  Arrhythmia induction with isuprel infused INTRODUCTION: Molly Small is a 51 y.o. female with a history of symptomatic r PVCs who presents today for EP study and radiofrequency ablation. The patient has had recurrent symptomatic PVCs.  She now presents for EP study and radiofrequency ablation of PVCs. DESCRIPTION OF PROCEDURE: Informed written consent was obtained and the patient was brought to the Electrophysiology Lab in the fasting state. The patient was adequately sedated with intravenous medication as outlined in the anesthesia report. The patient's right groin was prepped and draped in the usual sterile fashion by the EP Lab staff. Using a percutaneous Seldinger technique, one 8 [place] and one 9 French hemostasis and one 7 [place] hemostasis sheath were placed in the right femoral vein.  One 8 [place]  hemostasis sheath was placed in the right femoral artery.  A 6 French Josephson quadripolar catheter was placed through the right femoral vein and into the right ventricle.   Direct ultrasound guidance is used for right femoral artery and vein with normal vessel patency. Ultrasound images are captured and stored in the patient's chart. Using ultrasound guidance, the Brockenbrough needle and wire were visualized entering the vessel. A Biosense Webster sound star 8 French catheter was placed into the right ventricle through the 9 [place] hemostasis sheath.  3D sound mapping was performed of the right ventricle, right ventricular outflow tract, left ventricular outflow tract. Presenting Measurements: The patient presented to the Electrophysiology Lab in sinus rhythm. The PR interval was 148 msec with a QRS of 102 msec and a Qt of 372 msec. The average RR interval was 668 msec. Three-dimensional mapping: Mapping of PVCs were performed initially in the right ventricle.  A Biosense Webster ST SF DF catheter was placed into the right ventricle.  Mapping of this area showed no early signals.  The signal in the right ventricular outflow tract was late compared to the QRS complex.  Due to that, the ablation catheter was placed through the right femoral artery and into the left ventricle.  Mapping around the right and left coronary cusps revealed no early signals.  The ablation catheter was thus prolapsed into the left ventricle.  Mapping inferior to the right coronary cusp revealed early signals.  Signals were 20 Molly early compared to the clinical PVC.  Throughout this time, there was a high burden of ectopy.  The patient also developed a right bundle branch block, likely due to catheter manipulation in the left ventricle.  Pacing was performed in the left ventricle in the area of the earliest signal.  Pace mapping showed a 75% match in this area, though there was quite a bit of ectopy and possible fusion beats.  When the  patient was having sinus beats in the area of the early signal, there was an obvious hiss on both the proximal and distal poles of the ablation catheter.  Due to proximity close to the His bundle, no ablation was performed. Measurements following EP study: Following EP study, the patient's PR interval was 98 Molly, QRS 162 Molly, QT 411 Molly, RR 675 Molly.  The AH measured 90 Molly, HV 53 Molly.  AV Wenckebach was 320 Molly. CONCLUSIONS: 1. Sinus rhythm upon presentation. 2.  The patient had a high burden of PVCs with isoproterenol infusion at 4 mcg/kg /min 3.  No ablation performed due to proximity of earliest signal to the His bundle 4.  No early apparent complications.   CT ANGIO CHEST AORTA W/CM & OR WO/CM  Result Date: 02/17/2022 CLINICAL  DATA:  Chest pain EXAM: CT ANGIOGRAPHY CHEST WITH CONTRAST TECHNIQUE: Multidetector CT imaging of the chest was performed using the standard protocol during bolus administration of intravenous contrast. Multiplanar CT image reconstructions and MIPs were obtained to evaluate the vascular anatomy. Noncontrast images of chest were also obtained. RADIATION DOSE REDUCTION: This exam was performed according to the departmental dose-optimization program which includes automated exposure control, adjustment of the mA and/or kV according to patient size and/or use of iterative reconstruction technique. CONTRAST:  148mL OMNIPAQUE IOHEXOL 350 MG/ML SOLN COMPARISON:  None Available. FINDINGS: Cardiovascular: There is no evidence of mural hematoma in thoracic aorta in the noncontrast images. There is homogeneous enhancement in thoracic aorta in the postcontrast images. There is no demonstrable intimal flap. Scattered coronary artery calcifications are seen. Major branches of thoracic aorta appear patent. There are no filling defects in the pulmonary artery branches. Mediastinum/Nodes: There is no evidence of mediastinal hematoma. There is no significant lymphadenopathy. Lungs/Pleura: There is no focal  pulmonary consolidation. Small linear densities in the posterior costophrenic angles may suggest minimal scarring or subsegmental atelectasis. There is no pleural effusion or pneumothorax. Upper Abdomen: There is fatty infiltration in the liver. Surgical clips are seen in gallbladder fossa. Surgical staples are seen in the stomach. Musculoskeletal: Unremarkable. Review of the MIP images confirms the above findings. IMPRESSION: There is no evidence of thoracic aortic dissection. There is no evidence pulmonary artery embolism. Scattered coronary artery calcifications are seen. Small linear densities in the posterior costophrenic angles on both sides may suggest minimal scarring or subsegmental atelectasis. There is no focal pulmonary consolidation. There is no pleural effusion or pneumothorax. Fatty liver. Electronically Signed   By: Elmer Picker M.D.   On: 02/17/2022 13:44   ECHOCARDIOGRAM COMPLETE  Result Date: 02/17/2022    ECHOCARDIOGRAM REPORT   Patient Name:   Molly Small Date of Exam: 02/17/2022 Medical Rec #:  [id number]     Height:       62.0 in Accession #:    [id number]    Weight:       155.0 lb Date of Birth:  [birth year]    BSA:          1.715 m Patient Age:    37 years      BP:           134/71 mmHg Patient Gender: F             HR:           74 bpm. Exam Location:  Inpatient Procedure: 2D Echo, Color Doppler and Cardiac Doppler STAT ECHO Indications:    Chest Pain  History:        Patient has prior history of Echocardiogram examinations.  Sonographer:    Raquel Sarna Senior RDCS Referring Phys: [id number] WILL MARTIN Mendenhall  1. Apex is akinetic. Left ventricular ejection fraction, by estimation, is 35 to 40%. The left ventricle has moderately decreased function. The left ventricle demonstrates global hypokinesis. Indeterminate diastolic filling due to E-A fusion.  2. Right ventricular systolic function is normal. The right ventricular size is normal. Tricuspid regurgitation signal is  inadequate for assessing PA pressure.  3. Left atrial size was mildly dilated.  4. The mitral valve is normal in structure. No evidence of mitral valve regurgitation.  5. The aortic valve is grossly normal. Aortic valve regurgitation is not visualized.  6. Aortic no signficant aortic root aneurysm. Conclusion(s)/Recommendation(s): Comparing images from the prior study EF not significantly different. FINDINGS  Left  Ventricle: Apex is akinetic. Left ventricular ejection fraction, by estimation, is 35 to 40%. The left ventricle has moderately decreased function. The left ventricle demonstrates global hypokinesis. The left ventricular internal cavity size was normal in size. There is no left ventricular hypertrophy. Abnormal (paradoxical) septal motion, consistent with left bundle branch block. Indeterminate diastolic filling due to E-A fusion. Right Ventricle: The right ventricular size is normal. Right ventricular systolic function is normal. Tricuspid regurgitation signal is inadequate for assessing PA pressure. Left Atrium: Left atrial size was mildly dilated. Right Atrium: Right atrial size was normal in size. Pericardium: There is no evidence of pericardial effusion. Mitral Valve: The mitral valve is normal in structure. No evidence of mitral valve regurgitation. Tricuspid Valve: The tricuspid valve is normal in structure. Tricuspid valve regurgitation is not demonstrated. Aortic Valve: The aortic valve is grossly normal. Aortic valve regurgitation is not visualized. Pulmonic Valve: Pulmonic valve regurgitation is not visualized. Aorta: No signficant aortic root aneurysm.  LEFT VENTRICLE PLAX 2D LVOT diam:     2.00 cm      Diastology LV SV:         34           LV e' medial:    6.09 cm/s LV SV Index:   20           LV E/e' medial:  7.6 LVOT Area:     3.14 cm     LV e' lateral:   9.57 cm/s                             LV E/e' lateral: 4.8  LV Volumes (MOD) LV vol d, MOD A2C: 116.0 ml LV vol d, MOD A4C: 106.0 ml LV  vol s, MOD A2C: 76.1 ml LV vol s, MOD A4C: 66.5 ml LV SV MOD A2C:     39.9 ml LV SV MOD A4C:     106.0 ml LV SV MOD BP:      41.7 ml RIGHT VENTRICLE TAPSE (M-mode): 1.8 cm LEFT ATRIUM             Index        RIGHT ATRIUM           Index LA Vol (A2C):   49.7 ml 28.97 ml/m  RA Area:     12.50 cm LA Vol (A4C):   61.9 ml 36.08 ml/m  RA Volume:   27.60 ml  16.09 ml/m LA Biplane Vol: 57.1 ml 33.29 ml/m  AORTIC VALVE LVOT Vmax:   63.30 cm/s LVOT Vmean:  45.000 cm/s LVOT VTI:    0.109 m  AORTA Ao Root diam: 3.10 cm MITRAL VALVE MV Area (PHT): 2.46 cm    SHUNTS MV Decel Time: 308 msec    Systemic VTI:  0.11 m MV E velocity: 46.30 cm/s  Systemic Diam: 2.00 cm MV A velocity: 87.40 cm/s MV E/A ratio:  0.53 Molly Small Scientist, physiological signed by Phineas Inches Signature Date/Time: 02/17/2022/2:52:26 PM    Final       Assessment & Plan    Molly Small is a 51yo woman with PVCs who was admitted with chest pain after an aborted PVC ablation. During mappign the PVC localized to adjacent to the His bundle precluding safe ablation. After, she had chest pain. CTA aorta negative for dissection and echo with new WMA and decreased EF. LHC performed which showed no significant coronary disease.  #Chest pain Unclear cause. Improving. Possibly  musculoskeletal. Troponins negative. LHC negative. CTA aorta negative.  #PVCs Outpatient follow up. Restart flecainide.   OK to discharge.  For questions or updates, please contact [place] Please consult [email or web address] for contact info under        Signed, Vickie Epley, MD  02/19/2022, 7:35 AM

## 2022-02-19 NOTE — Plan of Care (Signed)
  Problem: Cardiovascular: Goal: Ability to achieve and maintain adequate cardiovascular perfusion will improve Outcome: Progressing Goal: Vascular access site(s) Level 0-1 will be maintained Outcome: Progressing   Problem: Health Behavior/Discharge Planning: Goal: Ability to safely manage health-related needs after discharge will improve Outcome: Progressing   Problem: Education: Goal: Knowledge of General Education information will improve Description: Including pain rating scale, medication(s)/side effects and non-pharmacologic comfort measures Outcome: Progressing   Problem: Coping: Goal: Level of anxiety will decrease Outcome: Progressing   Problem: Pain Managment: Goal: General experience of comfort will improve Outcome: Progressing   Problem: Cardiac: Goal: Ability to achieve and maintain adequate cardiopulmonary perfusion will improve Outcome: Progressing

## 2022-02-20 ENCOUNTER — Encounter (HOSPITAL_COMMUNITY): Payer: Self-pay | Admitting: Cardiology

## 2022-02-20 LAB — LIPOPROTEIN A (LPA): Lipoprotein (a): 70.1 nmol/L — ABNORMAL HIGH (ref <75.0)

## 2022-03-08 ENCOUNTER — Ambulatory Visit (INDEPENDENT_AMBULATORY_CARE_PROVIDER_SITE_OTHER): Payer: 59 | Admitting: Cardiology

## 2022-03-08 ENCOUNTER — Encounter: Payer: Self-pay | Admitting: Cardiology

## 2022-03-08 VITALS — BP 110/72 | HR 51 | Ht 62.0 in | Wt 157.2 lb

## 2022-03-08 DIAGNOSIS — I428 Other cardiomyopathies: Secondary | ICD-10-CM | POA: Insufficient documentation

## 2022-03-08 DIAGNOSIS — Z9884 Bariatric surgery status: Secondary | ICD-10-CM | POA: Diagnosis not present

## 2022-03-08 DIAGNOSIS — I493 Ventricular premature depolarization: Secondary | ICD-10-CM

## 2022-03-08 HISTORY — DX: Ventricular premature depolarization: I49.3

## 2022-03-08 HISTORY — DX: Other cardiomyopathies: I42.8

## 2022-03-08 NOTE — Progress Notes (Addendum)
Cardiology Office Note:    Date:  03/08/2022   ID:  Molly Small, DOB 09-05-1971, MRN [id number]  PCP:  Molly Reil, MD  Cardiologist:  Molly Lindau, MD   Referring MD: Molly Reil, MD    ASSESSMENT:    1. Frequent PVCs   2. S/P bariatric surgery   3. Nonischemic cardiomyopathy ([place])   4. Symptomatic PVCs    PLAN:    In order of problems listed above:  Primary prevention stressed with the patient.  Importance of compliance with diet and medication stressed and she vocalized understanding. Nonischemic cardiomyopathy: I discussed findings with the patient at length.  Guideline directed medical therapy was considered.  Her blood pressure is very borderline.  She is concerned about hypotension with medication such as Entresto.  EKG done today reveals sinus rhythm and nonspecific ST-T changes.   Frequent PVCs with cardiomyopathy: Patient request second opinion and she mentions to me that she would seek an opinion at a tertiary center.  She was happy with the services that we have provided.  She will be seen in follow-up appointment in 9 months or earlier if she has any concerns.  I have mentioned to her that I would help her in every way such as providing all our records and sending a copy to whoever she wishes to see for this evaluation.  Patient had multiple questions which were answered to her satisfaction.   Medication Adjustments/Labs and Tests Ordered: Current medicines are reviewed at length with the patient today.  Concerns regarding medicines are outlined above.  Orders Placed This Encounter  Procedures   EKG 12-Lead   No orders of the defined types were placed in this encounter.    Chief Complaint  Patient presents with   Dizziness   heart fluttering     History of Present Illness:    Molly Small is a 51 y.o. female.  Patient has past medical history of PVCs which are frequent and symptomatic.  She was concerned about it.  She was sent for  ablation and possible evaluation for the same.  The ablation was not contacted as it was thought that the origin of the PVCs were close to the His bundle.  Subsequently the patient has undergone coronary angiography with anatomically normal-appearing coronary arteries and moderately depressed ejection fraction.  She is very concerned because she is very symptomatic from the PVCs and this affects her quality of life.  She is requesting a second opinion.  At the time of my evaluation, the patient is alert awake oriented and in no distress.  Past Medical History:  Diagnosis Date   Acute cystitis without hematuria 07/06/2021   Last Assessment & Plan:  Formatting of this note might be different from the original. POCT UA in the office today reveals + nitrites and hematuria. Will send UA for micro and reflex culture and treat with ABX as appropriate.   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 Patient reports decreased clitoral sensation in decrease or gas mixed response.  We discussed options of compound medication which include constituents of nitroglycerin to improve blood flow.  [facility] in [place] provides 3 levels of compound cream.  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 Discussed findings  of atrophic vaginitis. The pathophysiology of this disease process and the effects upon the vaginal epithelium and urothelium were reviewed in detail.   Options of local vaginal estrogen cream/pills/oral SERM were discussed. Benefits include restoration of  normal vaginal pH and thickening of    Cardiac murmur 07/15/2021   Cervical radiculitis 05/03/2017   Chest pain of uncertain etiology 07/15/2021   Chronic midline low back pain without sciatica 06/28/2018   Formatting of  this note might be different from the original. Last Assessment & Plan:  Formatting of this note might be different from the original. We discussed that at this time given she would not want any surgical intervention and does not have any worsening red flag symptoms, she would be unlikely to benefit from MRI. Will continue to work on weight loss to reduce weight on spine. Last Assess   Class 3 severe obesity due to excess calories with serious comorbidity and body mass index (BMI) of 45.0 to 49.9 in adult [facility]) 04/12/2018   Formatting of this note might be different from the original. Last Assessment & Plan:  Formatting of this note might be different from the original. BMI Assessment: Current Body mass index is 48.21 kg/m.  Patient BMI currently is above average (>25 kg/m2); BMI follow up plan is completed . Current barriers to healthy weight management include none.  BMI Plan:  Today, Vannesa and I have discussed    COVID-19 virus infection 05/08/2020   Last Assessment & Plan:  Formatting of this note might be different from the original. Continues to be symptomatic with fatigue. Will provide work note for a few more days to see if she is able to return to work.   Depression    Dermatitis 08/18/2021   Last Assessment & Plan:  Formatting of this note might be different from the original. Chronic worse. Likely due to recurrent irritation from skin due to weight loss. Recommend cortisone bid until rash improves.   Essential hypertension 04/28/2016   Formatting of this note might be different from the original. Last Assessment & Plan:  Formatting of this note might be different from the original. Chronic, stable. Continue current rx regimen. Last Assessment & Plan:  Formatting of this note might be different from the original. Chronic, stable. Controlled. Continue with current regimen.   Frequent PVCs 07/15/2021   Gastroesophageal reflux disease 04/28/2016   Formatting of this note might be different from  the original. Last Assessment & Plan:  Formatting of this note might be different from the original. Fu with GI for egd Last Assessment & Plan:  Formatting of this note might be different from the original. Fu with GI for egd   History of recurrent UTIs 07/06/2021   Last Assessment & Plan:  Formatting of this note might be different from the original. Patient reports longstanding history of UTIs. Discussed the importance of needing urine cultures when patient symptomatic with UTI since she is having recurrence of symptoms. She does have +nitrites in urine today, will send for UA/culture today.   Hypokalemia 05/08/2020   Lymphadenopathy 10/17/2018   Last Assessment & Plan:  Formatting of this note might be different from the original. Improved on CT scan. Will plan for repeat in 3 months   Metabolic syndrome X 04/28/2016   Mixed anxiety and depressive disorder 05/03/2017   Mixed hyperlipidemia 05/31/2020   Last Assessment & Plan:  Formatting of this note might be different from the original. Chronic, not controlled. Will stop lipitor and change  to crestor. Repeat labs prior to next visit.   Moderate obstructive sleep apnea 08/04/2020   Moderate persistent asthma without complication 12/05/2019   Last Assessment & Plan:  Formatting of this note might be different from the original. Chronic, stable. Controlled. Continue with current regimen.   Overactive bladder 07/06/2021   Last Assessment & Plan:  Formatting of this note might be different from the original. Given the patient's irritative voiding symptoms of urgency and/or frequency and/or nocturia, we will proceed with urodynamic testing. The [employer] urodynamic testing fact sheet was provided to the patient after review in the office. Patient will return the office after investigative procedures to review her results    Overweight with body mass index (BMI) of 29 to 29.9 in adult 04/12/2018   Last Assessment & Plan:  Formatting of this note might be  different from the original. BMI Assessment: Current Body mass index is 37 kg/m.  Patient BMI currently is above average (>25 kg/m2); BMI follow up plan is completed . Current barriers to healthy weight management include none.  BMI Plan:  Today, Treasure and I have discussed methods to help address her current weight.   congratulated on we   Palpitations 07/15/2021   Paresthesias 10/06/2021   Last Assessment & Plan:  Formatting of this note might be different from the original. Recommend checking labs given she is s/p gastric bypass. Has not had these checked in about 6 months. MRI w/wo and referral to neurology input. She's aware of ER precautions.   Primary insomnia 12/02/2021   Last Assessment & Plan:  Formatting of this note might be different from the original. Discussed good sleep hygiene habits.   Protein-calorie malnutrition (HCC) 10/15/2020   PVC's (premature ventricular contractions) 02/17/2022   Right sided numbness 05/08/2020   Last Assessment & Plan:  Formatting of this note might be different from the original. Chronic, resolved. MRI reviewed with no evidence of stroke   S/P bariatric surgery 10/15/2020   Spasm of thoracic back muscle 01/18/2018   Last Assessment & Plan:  Formatting of this note might be different from the original. Continue with the robaxin at night to prevent muscle spasms. I have placed a referral for you to see Dr. Erline Levine.   Tinea corporis 08/02/2021   Last Assessment & Plan:  Formatting of this note might be different from the original. Several erythematous, mildly raised papules which are well demarcated with some scaling and central clearing on bilateral forearms and anterior chest wall   She was seen at urgent care for this - started on PO antifungal - has taken 2 weeks of it (2 doses) No improvement  Will do topical antifungal daily - conti   Type 2 diabetes mellitus without complication, without long-term current use of insulin (HCC) 04/28/2016   Formatting of this  note might be different from the original. Last Assessment & Plan:  Formatting of this note is different from the original. Pertinent Data:   Current medication includes: Ozempic pen injector - 1 mg/dose (2 mg/1.5 mL).  Hemoglobin A1C (%)  Date Value  10/31/2019 6.3 (H)  07/11/2019 6.6 (H)   Microalb/Creat Ratio (mg/g)  Date Value  07/11/2019 5   Creatinine (mg/dL)  Date Value     Urgency incontinence 07/06/2021    Past Surgical History:  Procedure Laterality Date   CHOLECYSTECTOMY     LEFT HEART CATH AND CORONARY ANGIOGRAPHY N/A 02/17/2022   Procedure: LEFT HEART CATH AND CORONARY ANGIOGRAPHY;  Surgeon: Orbie Pyo, MD;  Location: [facility];  Service: Cardiovascular;  Laterality: N/A;   PVC ABLATION N/A 02/17/2022   Procedure: PVC ABLATION;  Surgeon: Regan Lemming, MD;  Location: [facility];  Service: Cardiovascular;  Laterality: N/A;    Current Medications: Current Meds  Medication Sig   aspirin EC 81 MG tablet Take 81 mg by mouth daily. Swallow whole.   BIOTIN PO Take 1 capsule by mouth daily.   COLLAGEN PO Take 1 Dose by mouth daily. Powder   flecainide (TAMBOCOR) 50 MG tablet Take 1 tablet (50 mg total) by mouth 2 (two) times daily.   levocetirizine (XYZAL) 5 MG tablet Take 5 mg by mouth daily as needed for allergies.   nystatin cream (MYCOSTATIN) Apply 1 application  topically 2 (two) times daily as needed for dry skin (rash).   OVER THE COUNTER MEDICATION Take 1 capsule by mouth daily. Bariatric vitamin   pantoprazole (PROTONIX) 40 MG tablet Take 40 mg by mouth as needed (reflux).   venlafaxine XR (EFFEXOR-XR) 37.5 MG 24 hr capsule Take 75 mg by mouth daily.   VITAMIN A PO Take 1 capsule by mouth daily.   VITAMIN D PO Take 1 capsule by mouth daily.   VITAMIN E PO Take 1 capsule by mouth daily.     Allergies:   Penicillins, Fexofenadine, Atorvastatin, and Bupropion   Social History   Socioeconomic History   Marital status: Single    Spouse name: Not  on file   Number of children: Not on file   Years of education: Not on file   Highest education level: Not on file  Occupational History   Not on file  Tobacco Use   Smoking status: Never   Smokeless tobacco: Never  Substance and Sexual Activity   Alcohol use: No    Alcohol/week: 0.0 standard drinks of alcohol   Drug use: No   Sexual activity: Never    Birth control/protection: Abstinence  Other Topics Concern   Not on file  Social History Narrative   Not on file   Social Determinants of Health   Financial Resource Strain: Not on file  Food Insecurity: Not on file  Transportation Needs: Not on file  Physical Activity: Not on file  Stress: Not on file  Social Connections: Not on file     Family History: The patient's family history includes Hyperlipidemia in her brother, father, and mother; Hypertension in her brother, father, and mother.  ROS:   Please see the history of present illness.    All other systems reviewed and are negative.  EKGs/Labs/Other Studies Reviewed:    The following studies were reviewed today: I discussed my findings with the patient at length echocardiogram and coronary angiography report was discussed with her at length.   Recent Labs: 02/14/2022: BUN 20; Creatinine, Ser 0.81; Hemoglobin 13.6; Platelets 249; Potassium 4.3; Sodium 145  Recent Lipid Panel No results found for: "CHOL", "TRIG", "HDL", "CHOLHDL", "VLDL", "LDLCALC", "LDLDIRECT"  Physical Exam:    VS:  BP 110/72 (BP Location: Left Arm, Patient Position: Sitting)   Pulse (!) 51   Ht 5\' 2"  (1.575 m)   Wt 157 lb 3.2 oz (71.3 kg)   SpO2 98%   BMI 28.75 kg/m     Wt Readings from Last 3 Encounters:  03/08/22 157 lb 3.2 oz (71.3 kg)  02/17/22 155 lb (70.3 kg)  01/09/22 157 lb (71.2 kg)     GEN: Patient is in no acute distress HEENT: Normal NECK: No JVD;  No carotid bruits LYMPHATICS: No lymphadenopathy CARDIAC: Hear sounds regular, 2/6 systolic murmur at the  apex. RESPIRATORY:  Clear to auscultation without rales, wheezing or rhonchi  ABDOMEN: Soft, non-tender, non-distended MUSCULOSKELETAL:  No edema; No deformity  SKIN: Warm and dry NEUROLOGIC:  Alert and oriented x 3 PSYCHIATRIC:  Normal affect   Signed, Molly Lindau, MD  03/08/2022 9:44 AM    [place]

## 2022-03-08 NOTE — Patient Instructions (Signed)

## 2022-03-27 ENCOUNTER — Ambulatory Visit: Payer: 59 | Admitting: Cardiology

## 2022-05-03 ENCOUNTER — Ambulatory Visit: Payer: 59 | Admitting: Cardiology

## 2022-06-21 ENCOUNTER — Ambulatory Visit: Payer: 59 | Admitting: Cardiology

## 2022-08-28 DIAGNOSIS — M722 Plantar fascial fibromatosis: Secondary | ICD-10-CM | POA: Insufficient documentation

## 2022-08-28 DIAGNOSIS — Z8616 Personal history of COVID-19: Secondary | ICD-10-CM

## 2022-08-28 HISTORY — DX: Plantar fascial fibromatosis: M72.2

## 2022-08-28 HISTORY — DX: Personal history of COVID-19: Z86.16

## 2022-09-06 ENCOUNTER — Other Ambulatory Visit (INDEPENDENT_AMBULATORY_CARE_PROVIDER_SITE_OTHER): Payer: Self-pay

## 2022-09-06 ENCOUNTER — Encounter (INDEPENDENT_AMBULATORY_CARE_PROVIDER_SITE_OTHER): Payer: Self-pay

## 2022-09-19 ENCOUNTER — Encounter (INDEPENDENT_AMBULATORY_CARE_PROVIDER_SITE_OTHER): Payer: Self-pay | Admitting: Plastic Surgery

## 2022-09-19 ENCOUNTER — Other Ambulatory Visit: Payer: Self-pay

## 2022-09-19 ENCOUNTER — Ambulatory Visit (INDEPENDENT_AMBULATORY_CARE_PROVIDER_SITE_OTHER): Payer: Self-pay | Admitting: Plastic Surgery

## 2022-09-19 DIAGNOSIS — L987 Excessive and redundant skin and subcutaneous tissue: Secondary | ICD-10-CM

## 2022-09-19 DIAGNOSIS — Z01818 Encounter for other preprocedural examination: Secondary | ICD-10-CM

## 2022-09-19 DIAGNOSIS — L304 Erythema intertrigo: Secondary | ICD-10-CM

## 2022-09-19 DIAGNOSIS — Z411 Encounter for cosmetic surgery: Secondary | ICD-10-CM

## 2022-09-19 DIAGNOSIS — Z9884 Bariatric surgery status: Secondary | ICD-10-CM

## 2022-09-19 DIAGNOSIS — Z8679 Personal history of other diseases of the circulatory system: Secondary | ICD-10-CM | POA: Insufficient documentation

## 2022-09-19 HISTORY — DX: Personal history of other diseases of the circulatory system: Z86.79

## 2022-09-19 NOTE — H&P (Signed)
PLASTIC SURGERY, [facility]  [address]  [place] [address]    History and Physical     Name: Grace Perez MRN:  [id number]   Date: 09/19/2022 Age: 52 y.o.       Chief Complaint: Skin Hanging--abdomen (Patient is here to discuss panniculectomy. Patient has gastric bypass surgery 09/11/2020. She has excess skin from starting weight of 289lb, she is down to 164lbs. She complains of repeated rash under the pannus. She has been treated with nystatin and clobetasol. ), Breasts Too Saggy (Patient has had a gastric bypass previously and significant weight loss. She would like to discuss breast augmentation. ), and SVT (Patient has an appt with electrophysiologist in [facility] on September 26 2022. She will obtain cardiac clearance at that time. )    History of Present Illness   Grace Perez is a 52 y.o. year old female who comes to clinic with complaints of excess skin following massive weight loss.  She is particularly concerned about the excess skin of her anterior abdomen.  She develops intertrigo in the summer months routinely and it has become resistant to topical treatments.  Additionally, she has a cosmetic complaint related to her asymmetric and ptotic breasts.  She would like to discuss breast lift and augmentation in addition to a medically necessary panniculectomy.    Patient Active Problem List    Diagnosis    Other cardiomyopathies (CMS HCC)    Primary insomnia    Paresthesias    Anorgasmia of female    Palpitations    Multiple premature ventricular complexes    Cardiac murmur    Chest pain of uncertain etiology    Depression    Urge incontinence of urine    Overactive bladder    Atrophic vaginitis    History of recurrent UTIs    S/P bariatric surgery    Protein-calorie malnutrition (CMS HCC)    Moderate obstructive sleep apnea    Mixed hyperlipidemia    Right sided numbness    Hypokalemia    Moderate persistent asthma without complication    Lymphadenopathy    Chronic  midline low back pain without sciatica    Class 3 severe obesity due to excess calories with serious comorbidity and body mass index (BMI) of 45.0 to 49.9 in adult (CMS HCC)    Mixed anxiety and depressive disorder    Cervical radiculitis    Type 2 diabetes mellitus without complication, without long-term current use of insulin (CMS HCC)    Metabolic syndrome X    Anxiety    Asthma    Essential hypertension    Gastroesophageal reflux disease     Past Medical History:   Diagnosis Date    Diabetes mellitus, type 2 (CMS HCC)          Past Surgical History:   Procedure Laterality Date    HX CARDIAC ABLATION      HX CHOLECYSTECTOMY      HX ENDOMETRIAL ABLATION      HX GASTRIC BYPASS           Current Outpatient Medications   Medication Sig    albuterol sulfate (PROAIR HFA) 90 mcg/actuation Inhalation oral inhaler Inhale 2 puffs every 4 hours by inhalation route as needed.    albuterol sulfate (PROVENTIL) 2.5 mg /3 mL (0.083 %) Inhalation nebulizer solution Inhale 3 mL every day by nebulization route for 1 day.    budesonide-formoteroL (SYMBICORT) 160-4.5 mcg/actuation Inhalation oral inhaler Inhale 2 puffs every  day by inhalation route for 90 days.    calcium 26-vit D3-magnesium 15 167 mg calcium- 1.67 mcg-83 mg Oral Capsule Take 1 Capsule by mouth    conjugated estrogens (PREMARIN) 0.625 mg/gram Vaginal Cream Apply 0.5gm per vagina nightly for one week then begin twice weekly dosing on Sunday and Wednesday    EPINEPHrine 0.3 mg/0.3 mL Injection Auto-Injector     esomeprazole magnesium (NEXIUM) 40 mg Oral Capsule, Delayed Release(E.C.) Take 1 Capsule (40 mg total) by mouth Every morning before breakfast    fluticasone propionate (FLONASE) 50 mcg/actuation Nasal Spray, Suspension Administer 2 Sprays into affected nostril(s)    LACTOBACILLUS RHAMNOSUS GG ORAL Take 1 Tablet by mouth Once a day    Levocetirizine (XYZAL) 5 mg Oral Tablet Take 1 Tablet (5 mg total) by mouth Once a day    LORazepam (ATIVAN) 0.5 mg Oral Tablet  Take 0.5 Tablets (0.25 mg total) by mouth    metFORMIN (GLUCOPHAGE) 500 mg Oral Tablet Take 1 tablet twice a day by oral route for 90 days.    montelukast (SINGULAIR) 10 mg Oral Tablet Take 1 Tablet (10 mg total) by mouth     Allergies   Allergen Reactions    Adhesive Rash     Slide strip - Acrylic   Blister    Penicillins Anaphylaxis, Hives/ Urticaria, Itching, Nausea/ Vomiting and Shortness of Breath    Sulfa (Sulfonamides) Anaphylaxis    Fexofenadine Swelling and  Other Adverse Reaction (Add comment)     Numbness in face     Numbness in face    Numbness in face     Numbness in face       Numbness in face  Numbness in face       Numbness in face    Atorvastatin Myalgia     Muscle pain / cramps     Muscle pain / cramps    Bupropion Mental Status Effect     Confusion (intolerance)     Family Medical History:       Problem Relation (Age of Onset)    Elevated Lipids Mother, Father    Hypertension (High Blood Pressure) Mother            Social History     Tobacco Use    Smoking status: Never    Smokeless tobacco: Never   Substance Use Topics    Alcohol use: Not Currently      Review of Systems  Ten point review is negative.  No impediment to normal wound healing is identified.  She has a history of an arrhythmia which has been resolved with cardiac ablation by the electrophysiologist.  She is on no cardiac medications presently.    Examination:  There were no vitals taken for this visit.      Awake and alert  In no acute distress  Pleasant and conversant  Pupils equal and round  Breathing is unlabored  Breast examination reveals asymmetry right smaller than left.  Grade 4 ptosis left.  Grade 2 ptosis on right.  Excess skin.  Good skin quality.  Chest hemi diameter 13 cm.  Examination her abdomen reveals port scars related to her laparoscopic bypass surgery.  No hernia.  Evidence of chronic intertrigo was noted.  No active intertrigo presently.  Skin excess hanging beneath her mons pubis.  Moves extremities  symmetrically    Data reviewed:    Assessment and Plan  Problem List Items Addressed This Visit    None  Visit Diagnoses  Excess skin of abdominal wall    -  Primary    Encounter for surgery for cosmetic deformity        Intertrigo        Status post gastric bypass for obesity              Breast asymmetry and asymmetric ptosis bilaterally.  Excess skin of the anterior abdomen.  We discussed augmentation mastopexy as well as a fleur-de-lis abdominoplasty.  Given the excess skin in the week current intertrigo this should be covered by her insurance.  We also discussed the necessary mastopexy types as well as augmentation and felt that a 325-425 cc implant would suffice, depending on her desires regarding size.  We discussed the risks, benefits, hopeful outcomes, and possible complications of each procedure in full and those included but were not limited to scarring, bleeding, infection, asymmetry, need for revisions, wound healing problems, capsular contracture, breast implant illness, and the unlikely but remote possibility of development of a lymphoma.  We discussed smooth round silicone gel implants and the reasons for their use in this type of operation.  All of her questions were answered.  Photographs were taken.  Written pamphlets information were given to her regarding the proposed procedures.  A cost estimate will be forwarded for her breast surgery operations and she will let us know she would like to proceed once insurance approval has been granted for panniculectomy with umbilical transposition.    Anise Salvo, MD

## 2022-09-20 ENCOUNTER — Encounter (INDEPENDENT_AMBULATORY_CARE_PROVIDER_SITE_OTHER): Payer: Self-pay | Admitting: Plastic Surgery

## 2022-09-25 NOTE — Addendum Note (Signed)
Addended by: Leonidas Romberg on: 09/25/2022 08:25 AM     Modules accepted: Orders

## 2022-11-07 DIAGNOSIS — Z8781 Personal history of (healed) traumatic fracture: Secondary | ICD-10-CM

## 2022-11-07 HISTORY — DX: Personal history of (healed) traumatic fracture: Z87.81

## 2022-12-20 ENCOUNTER — Ambulatory Visit: Payer: 59 | Attending: Cardiology | Admitting: Cardiology

## 2022-12-26 ENCOUNTER — Encounter: Payer: Self-pay | Admitting: Cardiology

## 2023-01-22 DIAGNOSIS — R55 Syncope and collapse: Secondary | ICD-10-CM | POA: Insufficient documentation

## 2023-01-22 DIAGNOSIS — I447 Left bundle-branch block, unspecified: Secondary | ICD-10-CM | POA: Insufficient documentation

## 2023-01-22 HISTORY — DX: Left bundle-branch block, unspecified: I44.7

## 2023-01-22 HISTORY — DX: Syncope and collapse: R55

## 2023-01-24 DIAGNOSIS — Z950 Presence of cardiac pacemaker: Secondary | ICD-10-CM

## 2023-01-24 HISTORY — DX: Presence of cardiac pacemaker: Z95.0

## 2023-02-22 ENCOUNTER — Encounter: Payer: Self-pay | Admitting: Cardiology

## 2023-02-22 NOTE — Progress Notes (Signed)
Cardiology Office Note:    Date:  02/23/2023   ID:  Molly Small, DOB [birth year], MRN [id number]  PCP:  Jamse Mead, MD   [facility] Providers Cardiologist:  Garwin Brothers, MD Electrophysiologist:  Will Jorja Loa, MD     Referring MD: Jamse Mead, MD   CC: follow up   History of Present Illness:    Molly Small is a 52 y.o. female with a hx of hypertension, PVCs, NICM, sick sinus syndrome s/p PPM, OSA, GERD, DM 2, overactive bladder, anxiety, s/p bariatric surgery, history of recurrent UTIs, depression.  02/17/22 - Plans for for her to undergo PVC ablation at [facility], during the mapping process the PVCs seem to be originating from the bundle of his and the procedure was aborted.  She then developed 10 out of 10 chest pain, CT of her chest with without dissection or PE.  Echocardiogram revealed an EF 35 to 40%, global hypokinesis, indeterminate diastolic function, LA mildly dilated.  She underwent urgent coronary angiography which revealed normal coronary arteries.  S/p radiofrequency ablation at [facility] on 06/26/2022, her flecainide was discontinued, noted LBBB.  Repeat monitor in November 2023 with PVC burden less than 1%. Followed by Dr. Read Drivers.   2D ECHOCARDIOGRAM 01/22/2023 @ [facility] The systolic function is globally normal. The estimated LV  ejection fraction is 65-70%. Diastolic function is normal.  Trace amount of mitral regurgitation, mild tricuspid  regurgitation, and mild pulmonic regurgitation.   She began experiencing syncopal episodes with symptomatic bradycardia. On 01/24/2023 she underwent implantation of a dual-chamber pacemaker, this was completed at [facility].  She presents today for follow-up with general cardiology.  She has been doing well since her pacemaker implantation although it has taken a toll on her mentally as well as physically.  She stays relatively active, even though it has been hard  for her to do so.  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  She will continue to follow with the EP either through [facility] or [facility], she is not sure which one she prefers to follow with yet. She denies chest pain, palpitations, dyspnea, pnd, orthopnea, n, v, dizziness, syncope, edema, weight gain, or early satiety.   Past Medical History:  Diagnosis Date   Acute cystitis without hematuria 07/06/2021   Last Assessment & Plan:  Formatting of this note might be different from the original. POCT UA in the office today reveals + nitrites and hematuria. Will send UA for micro and reflex culture and treat with ABX as appropriate.   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 Patient reports decreased clitoral sensation in decrease or gas mixed response.  We discussed options of compound medication which include constituents of nitroglycerin to improve blood flow.  [facility] in [place] provides 3 levels of compound cream.  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 Discussed findings of atrophic vaginitis. The pathophysiology of this disease process and the effects upon the vaginal epithelium and urothelium were reviewed in detail.   Options of local vaginal estrogen cream/pills/oral SERM were discussed. Benefits include restoration of  normal vaginal pH and thickening of    Cardiac murmur 07/15/2021   Cervical radiculitis  05/03/2017   Chest pain of uncertain etiology 07/15/2021   Chronic midline low back pain without sciatica 06/28/2018   Formatting of this note might be different from the original. Last Assessment & Plan:  Formatting of this note might be different from the original. We discussed that at this time given she would  not want any surgical intervention and does not have any worsening red flag symptoms, she would be unlikely to benefit from MRI. Will continue to work on weight loss to reduce weight on spine. Last Assess   COVID-19 virus infection 05/08/2020   Last Assessment & Plan:  Formatting of this note might be different from the original. Continues to be symptomatic with fatigue. Will provide work note for a few more days to see if she is able to return to work.   Depression    Dermatitis 08/18/2021   Last Assessment & Plan:  Formatting of this note might be different from the original. Chronic worse. Likely due to recurrent irritation from skin due to weight loss. Recommend cortisone bid until rash improves.   Essential hypertension 04/28/2016   Formatting of this note might be different from the original. Last Assessment & Plan:  Formatting of this note might be different from the original. Chronic, stable. Continue current rx regimen. Last Assessment & Plan:  Formatting of this note might be different from the original. Chronic, stable. Controlled. Continue with current regimen.   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 Last Assessment & Plan:  Formatting of this note might be different from the original. Fu with GI for egd Last Assessment & Plan:  Formatting of this note might be different from the original. Fu with GI for egd   History of 2019 novel coronavirus disease (COVID-19) 08/28/2022   History of recurrent UTIs 07/06/2021   Last Assessment & Plan:  Formatting of this note might be different from the original. Patient reports longstanding history of UTIs. Discussed the importance of needing urine cultures when patient symptomatic with UTI since she is having recurrence of symptoms. She does have +nitrites in urine today, will send for UA/culture today.   Hx of fracture of wrist 11/07/2022   Last Assessment  & Plan: Formatting of this note might be different from the original. Healing well. Will follow up with ortho.   Hypokalemia 05/08/2020   Lymphadenopathy 10/17/2018   Last Assessment & Plan:  Formatting of this note might be different from the original. Improved on CT scan. Will plan for repeat in 3 months   Metabolic syndrome X 04/28/2016   Mixed anxiety and depressive disorder 05/03/2017   Moderate obstructive sleep apnea 08/04/2020   Moderate persistent asthma without complication 12/05/2019   Last Assessment & Plan:  Formatting of this note might be different from the original. Chronic, stable. Controlled. Continue with current regimen.   Nonischemic cardiomyopathy (HCC) 03/08/2022   Overactive bladder 07/06/2021   Last Assessment & Plan:  Formatting of this note might be different from the original. Given the patient's irritative voiding symptoms of urgency and/or frequency and/or nocturia, we will proceed with urodynamic testing. The AUGS urodynamic testing fact sheet was provided to the patient after review in the office. Patient will return the office after investigative procedures to review her results    Overweight with body mass index (BMI) of 29 to 29.9 in adult 04/12/2018   Last Assessment & Plan:  Formatting of this note  might be different from the original. BMI Assessment: Current Body mass index is 37 kg/m.  Patient BMI currently is above average (>25 kg/m2); BMI follow up plan is completed . Current barriers to healthy weight management include none.  BMI Plan:  Today, Daine and I have discussed methods to help address her current weight.   congratulated on we   Pacemaker 01/24/2023   [facility]   Palpitations 07/15/2021   Paresthesias 10/06/2021   Last Assessment & Plan:  Formatting of this note might be different from the original. Recommend checking labs given she is s/p gastric bypass. Has not had these checked in about 6 months. MRI w/wo and referral to  neurology input. She's aware of ER precautions.   Plantar fasciitis of right foot 08/28/2022   Primary insomnia 12/02/2021   Last Assessment & Plan:  Formatting of this note might be different from the original. Discussed good sleep hygiene habits.   Protein-calorie malnutrition (HCC) 10/15/2020   PVC's (premature ventricular contractions) 02/17/2022   Right sided numbness 05/08/2020   Last Assessment & Plan:  Formatting of this note might be different from the original. Chronic, resolved. MRI reviewed with no evidence of stroke   S/P bariatric surgery 10/15/2020   Spasm of thoracic back muscle 01/18/2018   Last Assessment & Plan:  Formatting of this note might be different from the original. Continue with the robaxin at night to prevent muscle spasms. I have placed a referral for you to see Dr. Erline Levine.   Status post radiofrequency ablation operation for arrhythmia 09/19/2022   Formatting of this note might be different from the original. 06/26/2022   Symptomatic PVCs 03/08/2022   Tinea corporis 08/02/2021   Last Assessment & Plan:  Formatting of this note might be different from the original. Several erythematous, mildly raised papules which are well demarcated with some scaling and central clearing on bilateral forearms and anterior chest wall   She was seen at urgent care for this - started on PO antifungal - has taken 2 weeks of it (2 doses) No improvement  Will do topical antifungal daily - conti   Type 2 diabetes mellitus without complication, without long-term current use of insulin (HCC) 04/28/2016   Formatting of this note might be different from the original. Last Assessment & Plan:  Formatting of this note is different from the original. Pertinent Data:   Current medication includes: Ozempic pen injector - 1 mg/dose (2 mg/1.5 mL).  Hemoglobin A1C (%)  Date Value  10/31/2019 6.3 (H)  07/11/2019 6.6 (H)   Microalb/Creat Ratio (mg/g)  Date Value  07/11/2019 5   Creatinine (mg/dL)  Date Value      Urgency incontinence 07/06/2021    Past Surgical History:  Procedure Laterality Date   CHOLECYSTECTOMY     LEFT HEART CATH AND CORONARY ANGIOGRAPHY N/A 02/17/2022   Procedure: LEFT HEART CATH AND CORONARY ANGIOGRAPHY;  Surgeon: Orbie Pyo, MD;  Location: [facility];  Service: Cardiovascular;  Laterality: N/A;   PVC ABLATION N/A 02/17/2022   Procedure: PVC ABLATION;  Surgeon: Regan Lemming, MD;  Location: [facility]AS[facility];  Service: Cardiovascular;  Laterality: N/A;    Current Medications: Current Meds  Medication Sig   BIOTIN PO Take 1 capsule by mouth daily.   COLLAGEN PO Take 1 Dose by mouth daily. Powder   desvenlafaxine (PRISTIQ) 50 MG 24 hr tablet Take 50 mg by mouth daily.   levocetirizine (XYZAL) 5 MG tablet Take 5 mg  by mouth daily as needed for allergies.   nystatin cream (MYCOSTATIN) Apply 1 application  topically 2 (two) times daily as needed for dry skin (rash).   OVER THE COUNTER MEDICATION Take 1 capsule by mouth daily. Bariatric vitamin   pantoprazole (PROTONIX) 40 MG tablet Take 40 mg by mouth as needed (reflux).   VITAMIN A PO Take 1 capsule by mouth daily.   VITAMIN D PO Take 1 capsule by mouth daily.   VITAMIN E PO Take 1 capsule by mouth daily.   [DISCONTINUED] aspirin EC 81 MG tablet Take 81 mg by mouth daily. Swallow whole.   [DISCONTINUED] flecainide (TAMBOCOR) 50 MG tablet Take 1 tablet (50 mg total) by mouth 2 (two) times daily.   [DISCONTINUED] venlafaxine XR (EFFEXOR-XR) 37.5 MG 24 hr capsule Take 75 mg by mouth daily.     Allergies:   Fentanyl, Penicillins, Fexofenadine, Atorvastatin, and Bupropion   Social History   Socioeconomic History   Marital status: Single    Spouse name: Not on file   Number of children: Not on file   Years of education: Not on file   Highest education level: Not on file  Occupational History   Not on file  Tobacco Use   Smoking status: Never   Smokeless tobacco: Never  Substance and Sexual  Activity   Alcohol use: No    Alcohol/week: 0.0 standard drinks of alcohol   Drug use: No   Sexual activity: Never    Birth control/protection: Abstinence  Other Topics Concern   Not on file  Social History Narrative   Not on file   Social Determinants of Health   Financial Resource Strain: Not on file  Food Insecurity: Not on file  Transportation Needs: Not on file  Physical Activity: Not on file  Stress: Not on file  Social Connections: Not on file     Family History: The patient's family history includes Hyperlipidemia in her brother, father, and mother; Hypertension in her brother, father, and mother.  ROS:   Please see the history of present illness.     All other systems reviewed and are negative.  EKGs/Labs/Other Studies Reviewed:    The following studies were reviewed today: Cardiac Studies & Procedures   CARDIAC CATHETERIZATION  CARDIAC CATHETERIZATION 02/17/2022  Narrative   LV end diastolic pressure is normal.   The left ventricular ejection fraction is 35-45% by visual estimate.  1.  Normal right dominant system coronary circulation. 2.  LVEDP of 15 mmHg with ventriculography-demonstrating ejection fraction approximately 30 to 45% with no evidence of Takotsubo cardiomyopathy.  Recommendation: Evaluate for noncardiac chest pain.  Findings Coronary Findings Diagnostic  Dominance: Right  No diagnostic findings have been documented. Intervention  No interventions have been documented.   STRESS TESTS  MYOCARDIAL PERFUSION IMAGING 07/19/2021  Narrative   Findings are consistent withprior ischemia and no prior myocardial infarction. The study is low risk.   No ST deviation was noted.   Left ventricular function is abnormal. Global function is mildly reduced. Nuclear stress EF: 48 %. The left ventricular ejection fraction is mildly decreased (45-54%). End diastolic cavity size is normal.   Prior study not available for comparison.   Good exercise  tolerance - 30 sec of Bruce protocol, negative stress test for ischemia. Noted mildly reduced EF. Consider echocardiogram to confirm.   ECHOCARDIOGRAM  ECHOCARDIOGRAM COMPLETE 02/17/2022  Narrative ECHOCARDIOGRAM REPORT    Patient Name:   ANAELY PERAZA Date of Exam: 02/17/2022 Medical Rec #:  [id number]  Height:       62.0 in Accession #:    [id number]    Weight:       155.0 lb Date of Birth:  June 27, 1971    BSA:          1.715 m Patient Age:    50 years      BP:           134/71 mmHg Patient Gender: F             HR:           74 bpm. Exam Location:  Inpatient  Procedure: 2D Echo, Color Doppler and Cardiac Doppler  STAT ECHO  Indications:    Chest Pain  History:        Patient has prior history of Echocardiogram examinations.  Sonographer:    Irving Burton Senior RDCS Referring Phys: [id number] WILL MARTIN CAMNITZ  IMPRESSIONS   1. Apex is akinetic. Left ventricular ejection fraction, by estimation, is 35 to 40%. The left ventricle has moderately decreased function. The left ventricle demonstrates global hypokinesis. Indeterminate diastolic filling due to E-A fusion. 2. Right ventricular systolic function is normal. The right ventricular size is normal. Tricuspid regurgitation signal is inadequate for assessing PA pressure. 3. Left atrial size was mildly dilated. 4. The mitral valve is normal in structure. No evidence of mitral valve regurgitation. 5. The aortic valve is grossly normal. Aortic valve regurgitation is not visualized. 6. Aortic no signficant aortic root aneurysm.  Conclusion(s)/Recommendation(s): Comparing images from the prior study EF not significantly different.  FINDINGS Left Ventricle: Apex is akinetic. Left ventricular ejection fraction, by estimation, is 35 to 40%. The left ventricle has moderately decreased function. The left ventricle demonstrates global hypokinesis. The left ventricular internal cavity size was normal in size. There is no left ventricular  hypertrophy. Abnormal (paradoxical) septal motion, consistent with left bundle branch block. Indeterminate diastolic filling due to E-A fusion.  Right Ventricle: The right ventricular size is normal. Right ventricular systolic function is normal. Tricuspid regurgitation signal is inadequate for assessing PA pressure.  Left Atrium: Left atrial size was mildly dilated.  Right Atrium: Right atrial size was normal in size.  Pericardium: There is no evidence of pericardial effusion.  Mitral Valve: The mitral valve is normal in structure. No evidence of mitral valve regurgitation.  Tricuspid Valve: The tricuspid valve is normal in structure. Tricuspid valve regurgitation is not demonstrated.  Aortic Valve: The aortic valve is grossly normal. Aortic valve regurgitation is not visualized.  Pulmonic Valve: Pulmonic valve regurgitation is not visualized.  Aorta: No signficant aortic root aneurysm.   LEFT VENTRICLE PLAX 2D LVOT diam:     2.00 cm      Diastology LV SV:         34           LV e' medial:    6.09 cm/s LV SV Index:   20           LV E/e' medial:  7.6 LVOT Area:     3.14 cm     LV e' lateral:   9.57 cm/s LV E/e' lateral: 4.8  LV Volumes (MOD) LV vol d, MOD A2C: 116.0 ml LV vol d, MOD A4C: 106.0 ml LV vol s, MOD A2C: 76.1 ml LV vol s, MOD A4C: 66.5 ml LV SV MOD A2C:     39.9 ml LV SV MOD A4C:     106.0 ml LV SV MOD BP:      41.7  ml  RIGHT VENTRICLE TAPSE (M-mode): 1.8 cm  LEFT ATRIUM             Index        RIGHT ATRIUM           Index LA Vol (A2C):   49.7 ml 28.97 ml/m  RA Area:     12.50 cm LA Vol (A4C):   61.9 ml 36.08 ml/m  RA Volume:   27.60 ml  16.09 ml/m LA Biplane Vol: 57.1 ml 33.29 ml/m AORTIC VALVE LVOT Vmax:   63.30 cm/s LVOT Vmean:  45.000 cm/s LVOT VTI:    0.109 m  AORTA Ao Root diam: 3.10 cm  MITRAL VALVE MV Area (PHT): 2.46 cm    SHUNTS MV Decel Time: 308 msec    Systemic VTI:  0.11 m MV E velocity: 46.30 cm/s  Systemic Diam: 2.00 cm MV  A velocity: 87.40 cm/s MV E/A ratio:  0.53  Photographer signed by Carolan Clines Signature Date/Time: 02/17/2022/2:52:26 PM    Final    MONITORS  LONG TERM MONITOR (3-14 DAYS) 08/03/2021  Narrative Patch Wear Time:  10 days and 9 hours (2022-10-28T11:36:49-0400 to 2022-11-07T20:17:41-0500)  Patient had a min HR of 39 bpm, max HR of 151 bpm, and avg HR of 68 bpm.  Predominant underlying rhythm was Sinus Rhythm. 3 Supraventricular Tachycardia runs occurred, the run with the fastest interval lasting 13 beats with a max rate of 141 bpm (avg 127 bpm); the run with the fastest interval was also the longest. Isolated SVEs were rare (<1.0%), SVE Couplets were rare (<1.0%), and SVE Triplets were rare (<1.0%). Some episodes of Supraventricular Tachycardia may be possible Atrial Tachycardia with variable block.  Isolated VEs were frequent (14.1%, 140185), VE Couplets were rare (<1.0%, 570), and VE Triplets were rare (<1.0%, 6). Ventricular Bigeminy and Trigeminy were present. Inverted QRS complexes possibly due to inverted placement of device.  Impression: Mildly abnormal but largely unremarkable event monitoring.  Significant PVC burden noted on monitoring.            EKG:  EKG is  ordered today.  The ekg ordered today demonstrates atrial paced sinus rhythm, LBBB, heart rate 61 bpm.  Recent Labs: No results found for requested labs within last 365 days.  Recent Lipid Panel No results found for: "CHOL", "TRIG", "HDL", "CHOLHDL", "VLDL", "LDLCALC", "LDLDIRECT"   Risk Assessment/Calculations:                Physical Exam:    VS:  BP 116/78 (BP Location: Left Arm, Patient Position: Sitting)   Pulse 61   Ht 5\' 2"  (1.575 m)   Wt 156 lb 12.8 oz (71.1 kg)   SpO2 99%   BMI 28.68 kg/m     Wt Readings from Last 3 Encounters:  02/23/23 156 lb 12.8 oz (71.1 kg)  03/08/22 157 lb 3.2 oz (71.3 kg)  02/17/22 155 lb (70.3 kg)     GEN:  Well nourished, well developed in no  acute distress HEENT: Normal NECK: No JVD; No carotid bruits LYMPHATICS: No lymphadenopathy CARDIAC: RRR, no murmurs, rubs, gallops RESPIRATORY:  Clear to auscultation without rales, wheezing or rhonchi  ABDOMEN: Soft, non-tender, non-distended MUSCULOSKELETAL:  No edema; No deformity  SKIN: Warm and dry NEUROLOGIC:  Alert and oriented x 3 PSYCHIATRIC:  Normal affect   ASSESSMENT:    1. Frequent PVCs   2. Nonischemic cardiomyopathy (HCC)   3. Essential hypertension   4. Status post radiofrequency ablation operation for arrhythmia  5. Syncope and collapse    PLAN:    In order of problems listed above:  Frequent PVCs s/p ablation -had bothersome PVCs, had been on flecainide, ultimately decided to undergo ablation.  She began experiencing bradycardia and syncopal events after this. Syncope and collapse-occurred s/p ablation; no recurrence since implantation of PPM Pres[employer] of permanent pacemaker-dual-chamber pacemaker implanted on 01/24/2023, she is waiting for her home device upload her readings.  Surgical site is healing well.  She will continue with [facility] or [facility], she is not made her mind up which EP doctor will follow her. Hypertension-blood pressure is well-controlled at 116/78, not currently on any antihypertensive agents.   Disposition -follow-up with general cardiology in 1 year, sooner if needed.       Medication Adjustments/Labs and Tests Ordered: Current medicines are reviewed at length with the patient today.  Concerns regarding medicines are outlined above.  Orders Placed This Encounter  Procedures   EKG 12-Lead   No orders of the defined types were placed in this encounter.   Patient Instructions  Medication Instructions:  Your physician recommends that you continue on your current medications as directed. Please refer to the Current Medication list given to you today.  *If you need a refill on your cardiac medications before your next  appointment, please call your pharmacy*   Lab Work: NONE If you have labs (blood work) drawn today and your tests are completely normal, you will receive your results only by: MyChart Message (if you have MyChart) OR A paper copy in the mail If you have any lab test that is abnormal or we need to change your treatment, we will call you to review the results.   Testing/Procedures: NONE   Follow-Up: At [facility], you and your health needs are our priority.  As part of our continuing mission to provide you with exceptional heart care, we have created designated Provider Care Teams.  These Care Teams include your primary Cardiologist (physician) and Advanced Practice Providers (APPs -  Physician Assistants and Nurse Practitioners) who all work together to provide you with the care you need, when you need it.  We recommend signing up for the patient portal called "MyChart".  Sign up information is provided on this After Visit Summary.  MyChart is used to connect with patients for Virtual Visits (Telemedicine).  Patients are able to view lab/test results, encounter notes, upcoming appointments, etc.  Non-urgent messages can be sent to your provider as well.   To learn more about what you can do with MyChart, go to [email or web address].    Your next appointment:   1 year(s)  Provider:   Belva Crome, MD    Other Instructions     Signed, Flossie Dibble, NP  02/23/2023 5:03 PM    [facility]

## 2023-02-23 ENCOUNTER — Encounter: Payer: Self-pay | Admitting: Cardiology

## 2023-02-23 ENCOUNTER — Ambulatory Visit: Payer: 59 | Attending: Cardiology | Admitting: Cardiology

## 2023-02-23 VITALS — BP 116/78 | HR 61 | Ht 62.0 in | Wt 156.8 lb

## 2023-02-23 DIAGNOSIS — Z8679 Personal history of other diseases of the circulatory system: Secondary | ICD-10-CM

## 2023-02-23 DIAGNOSIS — R55 Syncope and collapse: Secondary | ICD-10-CM

## 2023-02-23 DIAGNOSIS — I10 Essential (primary) hypertension: Secondary | ICD-10-CM

## 2023-02-23 DIAGNOSIS — I428 Other cardiomyopathies: Secondary | ICD-10-CM

## 2023-02-23 DIAGNOSIS — Z9889 Other specified postprocedural states: Secondary | ICD-10-CM

## 2023-02-23 DIAGNOSIS — I493 Ventricular premature depolarization: Secondary | ICD-10-CM

## 2023-02-23 NOTE — Patient Instructions (Signed)
Medication Instructions:  Your physician recommends that you continue on your current medications as directed. Please refer to the Current Medication list given to you today.  *If you need a refill on your cardiac medications before your next appointment, please call your pharmacy*   Lab Work: NONE If you have labs (blood work) drawn today and your tests are completely normal, you will receive your results only by: MyChart Message (if you have MyChart) OR A paper copy in the mail If you have any lab test that is abnormal or we need to change your treatment, we will call you to review the results.   Testing/Procedures: NONE   Follow-Up: At [facility], you and your health needs are our priority.  As part of our continuing mission to provide you with exceptional heart care, we have created designated Provider Care Teams.  These Care Teams include your primary Cardiologist (physician) and Advanced Practice Providers (APPs -  Physician Assistants and Nurse Practitioners) who all work together to provide you with the care you need, when you need it.  We recommend signing up for the patient portal called "MyChart".  Sign up information is provided on this After Visit Summary.  MyChart is used to connect with patients for Virtual Visits (Telemedicine).  Patients are able to view lab/test results, encounter notes, upcoming appointments, etc.  Non-urgent messages can be sent to your provider as well.   To learn more about what you can do with MyChart, go to [email or web address].    Your next appointment:   1 year(s)  Provider:   Rajan Revankar, MD    Other Instructions   

## 2023-12-19 ENCOUNTER — Telehealth: Payer: Self-pay

## 2023-12-19 ENCOUNTER — Ambulatory Visit: Attending: Cardiology | Admitting: Cardiology

## 2023-12-19 VITALS — BP 120/78 | HR 80 | Ht 62.0 in | Wt 164.4 lb

## 2023-12-19 DIAGNOSIS — I493 Ventricular premature depolarization: Secondary | ICD-10-CM | POA: Diagnosis not present

## 2023-12-19 DIAGNOSIS — G4733 Obstructive sleep apnea (adult) (pediatric): Secondary | ICD-10-CM

## 2023-12-19 DIAGNOSIS — G43409 Hemiplegic migraine, not intractable, without status migrainosus: Secondary | ICD-10-CM | POA: Insufficient documentation

## 2023-12-19 DIAGNOSIS — Z9884 Bariatric surgery status: Secondary | ICD-10-CM

## 2023-12-19 DIAGNOSIS — Q2112 Patent foramen ovale: Secondary | ICD-10-CM | POA: Insufficient documentation

## 2023-12-19 DIAGNOSIS — I428 Other cardiomyopathies: Secondary | ICD-10-CM

## 2023-12-19 DIAGNOSIS — G43419 Hemiplegic migraine, intractable, without status migrainosus: Secondary | ICD-10-CM

## 2023-12-19 NOTE — Patient Instructions (Addendum)
   Dear Eden Emms  You are scheduled for a TEE (Transesophageal Echocardiogram) on Tuesday 12/25/2023 at 8:30.  Please arrive at the [facility] (Main Entrance A) at [facility]: [address] [place], [place] [address] at 7:00 am  (This time is 1.5 hour(s) before your procedure to ensure your preparation).   Free valet parking service is available. You will check in at ADMITTING.   *Please Note: You will receive a call the day before your procedure to confirm the appointment time. That time may have changed from the original time based on the schedule for that day.*  {DIET:  Nothing to eat or drink after midnight except a sip of water with medications (see medication instructions below)  MEDICATION INSTRUCTIONS:  Hold Lasix the day of the procedure.   LABS:   Your physician recommends that you have labs done in the office today. Your test included  basic metabolic panel, complete blood count. FYI:  For your safety, and to allow [place] to monitor your vital signs accurately during the surgery/procedure we request: If you have artificial nails, gel coating, SNS etc, please have those removed prior to your surgery/procedure. Not having the nail coverings /polish removed may result in cancellation or delay of your surgery/procedure.  Your support person will be asked to wait in the waiting room during your procedure.  It is OK to have someone drop you off and come back when you are ready to be discharged.  You cannot drive after the procedure and will need someone to drive you home.  Bring your insurance cards.  *Special Note: Every effort is made to have your procedure done on time. Occasionally there are emergencies that occur at the hospital that may cause delays. Please be patient if a delay does occur.

## 2023-12-19 NOTE — Telephone Encounter (Signed)
 Referral placed by Dr Josiah Lobo for patient to be evaluated by Dr Excell Seltzer for PFO evaluation.  The pt is currently scheduled for TEE on 4/8 and will need an appointment after this test is completed. Left message to patient to contact the office to arrange evaluation.

## 2023-12-19 NOTE — Progress Notes (Signed)
 Cardiology Office Note:    Date:  12/19/2023   ID:  Molly Small, DOB 1971/01/09, MRN [id number]  PCP:  Jamse Mead, MD  Cardiologist:  Garwin Brothers, MD   Referring MD: Jamse Mead, MD    ASSESSMENT:    1. Frequent PVCs   2. Nonischemic cardiomyopathy (HCC)   3. Moderate obstructive sleep apnea   4. S/P bariatric surgery   5. PFO (patent foramen ovale)   6. Intractable hemiplegic migraine without status migrainosus    PLAN:    In order of problems listed above:  Primary prevention stressed with the patient.  Importance of compliance with diet medication stressed and patient verbalized standing. Nonischemic cardiomyopathy: Patient is not on guideline directed medical therapy because of borderline blood pressure.  She hesitates to take medicine such as Entresto.  However she has been walking about 2 miles a day on a regular basis.  In my opinion her ejection fraction is significantly better than the previous evaluation.  I will consider medications such as Jardiance or [place] and I have discussed this with her.  She wants to her main complaint about this. Hemiplegic migraine and PFO: She has been recommended by neurology to consider closure of the PFO in view of disabling migraines.  I will set her up for TEE to evaluate the PFO further.  I have given her an appointment with structural heart disease doctor Dr. Excell Seltzer for further evaluation.  Procedure, benefits and potential risks explained to the patient and questions were answered to her satisfaction. Obesity: Weight reduction stressed and diet emphasized and she promises to do better. Patient will be seen in follow-up appointment in 6 months or earlier if the patient has any concerns.    Medication Adjustments/Labs and Tests Ordered: Current medicines are reviewed at length with the patient today.  Concerns regarding medicines are outlined above.  No orders of the defined types were placed in this  encounter.  No orders of the defined types were placed in this encounter.    No chief complaint on file.    History of Present Illness:    Molly Small is a 53 y.o. female.  Patient is a pleasant 53 year old female.  She has history of nonischemic cardiomyopathy.  She was in the hospital recently and was found to have hemiplegic migraine.  This has been a significant concern to her.  She exercises on a regular basis.  Echocardiogram revealed a positive bubble study suggesting interatrial shunting.  Past Medical History:  Diagnosis Date   Acute cystitis without hematuria 07/06/2021   Last Assessment & Plan:  Formatting of this note might be different from the original. POCT UA in the office today reveals + nitrites and hematuria. Will send UA for micro and reflex culture and treat with ABX as appropriate.   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 Patient reports decreased clitoral sensation in decrease or gas mixed response.  We discussed options of compound medication which include constituents of nitroglycerin to improve blood flow.  [facility] in [place] provides 3 levels of compound cream.  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 Discussed findings of atrophic vaginitis. The pathophysiology of this disease process and the effects upon the vaginal epithelium and urothelium  were reviewed in detail.   Options of local vaginal estrogen cream/pills/oral SERM were discussed. Benefits include restoration of  normal vaginal pH and thickening of    Cardiac murmur 07/15/2021   Cervical radiculitis 05/03/2017   Chest pain of uncertain etiology 07/15/2021   Chronic midline low back pain without sciatica 06/28/2018   Formatting of this note might be different  from the original. Last Assessment & Plan:  Formatting of this note might be different from the original. We discussed that at this time given she would not want any surgical intervention and does not have any worsening red flag symptoms, she would be unlikely to benefit from MRI. Will continue to work on weight loss to reduce weight on spine. Last Assess   COVID-19 virus infection 05/08/2020   Last Assessment & Plan:  Formatting of this note might be different from the original. Continues to be symptomatic with fatigue. Will provide work note for a few more days to see if she is able to return to work.   Depression    Dermatitis 08/18/2021   Last Assessment & Plan:  Formatting of this note might be different from the original. Chronic worse. Likely due to recurrent irritation from skin due to weight loss. Recommend cortisone bid until rash improves.   Essential hypertension 04/28/2016   Formatting of this note might be different from the original. Last Assessment & Plan:  Formatting of this note might be different from the original. Chronic, stable. Continue current rx regimen. Last Assessment & Plan:  Formatting of this note might be different from the original. Chronic, stable. Controlled. Continue with current regimen.   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 Last Assessment & Plan:  Formatting of this note might be different from the original. Fu with GI for egd Last Assessment & Plan:  Formatting of this note might be different from the original. Fu with GI for egd   History of 2019 novel coronavirus disease (COVID-19) 08/28/2022   History of recurrent UTIs 07/06/2021   Last Assessment & Plan:  Formatting of this note might be different from the original. Patient reports longstanding history of UTIs. Discussed the importance of needing urine cultures when patient symptomatic with UTI since she is having  recurrence of symptoms. She does have +nitrites in urine today, will send for UA/culture today.   Hx of fracture of wrist 11/07/2022   Last Assessment & Plan: Formatting of this note might be different from the original. Healing well. Will follow up with ortho.   Hypokalemia 05/08/2020   Left bundle branch block 01/22/2023   Last Assessment & Plan: Formatting of this note might be different from the original. Noted on EKG. -Management per EP.     Lymphadenopathy 10/17/2018   Last Assessment & Plan:  Formatting of this note might be different from the original. Improved on CT scan. Will plan for repeat in 3 months   Metabolic syndrome X 04/28/2016   Mixed anxiety and depressive disorder 05/03/2017   Moderate obstructive sleep apnea 08/04/2020   Moderate persistent asthma without complication 12/05/2019   Last Assessment & Plan:  Formatting of this note might be different from the original. Chronic, stable. Controlled. Continue with current regimen.   Nonischemic cardiomyopathy (HCC) 03/08/2022   Overactive bladder 07/06/2021   Last Assessment & Plan:  Formatting of this note might be different from the original. Given the patient's irritative voiding symptoms  of urgency and/or frequency and/or nocturia, we will proceed with urodynamic testing. The AUGS urodynamic testing fact sheet was provided to the patient after review in the office. Patient will return the office after investigative procedures to review her results    Overweight with body mass index (BMI) of 29 to 29.9 in adult 04/12/2018   Last Assessment & Plan:  Formatting of this note might be different from the original. BMI Assessment: Current Body mass index is 37 kg/m.  Patient BMI currently is above average (>25 kg/m2); BMI follow up plan is completed . Current barriers to healthy weight management include none.  BMI Plan:  Today, Librada and I have discussed methods to help address her current weight.   congratulated on we   Pacemaker  01/24/2023   [facility]   Palpitations 07/15/2021   Paresthesias 10/06/2021   Last Assessment & Plan:  Formatting of this note might be different from the original. Recommend checking labs given she is s/p gastric bypass. Has not had these checked in about 6 months. MRI w/wo and referral to neurology input. She's aware of ER precautions.   Plantar fasciitis of right foot 08/28/2022   Primary insomnia 12/02/2021   Last Assessment & Plan:  Formatting of this note might be different from the original. Discussed good sleep hygiene habits.   Protein-calorie malnutrition (HCC) 10/15/2020   PVC's (premature ventricular contractions) 02/17/2022   Right sided numbness 05/08/2020   Last Assessment & Plan:  Formatting of this note might be different from the original. Chronic, resolved. MRI reviewed with no evidence of stroke   S/P bariatric surgery 10/15/2020   Spasm of thoracic back muscle 01/18/2018   Last Assessment & Plan:  Formatting of this note might be different from the original. Continue with the robaxin at night to prevent muscle spasms. I have placed a referral for you to see Dr. Erline Levine.   Status post radiofrequency ablation operation for arrhythmia 09/19/2022   Formatting of this note might be different from the original. 06/26/2022   Symptomatic PVCs 03/08/2022   Syncope and collapse 01/22/2023   Last Assessment & Plan: Formatting of this note might be different from the original. No recurrence Etiology: symptomatic bradycardia likely. 2D echo 5/6  with EF 65-70%.  Diastolic function normal.  Left atrium cavity size is moderately dilated.  Trace amount of mitral regurgitation is visualized.  Mild tricuspid regurgitation and mild pulmonic regurgitation.     Tinea corporis 08/02/2021   Last Assessment & Plan:  Formatting of this note might be different from the original. Several erythematous, mildly raised papules which are well demarcated with some scaling and central clearing on  bilateral forearms and anterior chest wall   She was seen at urgent care for this - started on PO antifungal - has taken 2 weeks of it (2 doses) No improvement  Will do topical antifungal daily - conti   Type 2 diabetes mellitus without complication, without long-term current use of insulin (HCC) 04/28/2016   Formatting of this note might be different from the original. Last Assessment & Plan:  Formatting of this note is different from the original. Pertinent Data:   Current medication includes: Ozempic pen injector - 1 mg/dose (2 mg/1.5 mL).  Hemoglobin A1C (%)  Date Value  10/31/2019 6.3 (H)  07/11/2019 6.6 (H)   Microalb/Creat Ratio (mg/g)  Date Value  07/11/2019 5   Creatinine (mg/dL)  Date Value     Urgency incontinence 07/06/2021    Past Surgical  History:  Procedure Laterality Date   CHOLECYSTECTOMY     LEFT HEART CATH AND CORONARY ANGIOGRAPHY N/A 02/17/2022   Procedure: LEFT HEART CATH AND CORONARY ANGIOGRAPHY;  Surgeon: Orbie Pyo, MD;  Location: [facility] INVASIVE [facility];  Service: Cardiovascular;  Laterality: N/A;   PVC ABLATION N/A 02/17/2022   Procedure: PVC ABLATION;  Surgeon: Regan Lemming, MD;  Location: [facility]VAS[facility] CV LAB;  Service: Cardiovascular;  Laterality: N/A;    Current Medications: Current Meds  Medication Sig   ATIVAN 2 MG tablet Take 2 mg by mouth at bedtime.   BIOTIN PO Take 1 capsule by mouth daily.   COLLAGEN PO Take 1 Dose by mouth daily. Powder   desvenlafaxine (PRISTIQ) 100 MG 24 hr tablet Take 100 mg by mouth daily.   furosemide (LASIX) 20 MG tablet Take 20 mg by mouth daily as needed for fluid or edema.   levocetirizine (XYZAL) 5 MG tablet Take 5 mg by mouth daily as needed for allergies.   OVER THE COUNTER MEDICATION Take 1 capsule by mouth daily. Bariatric vitamin   pantoprazole (PROTONIX) 40 MG tablet Take 40 mg by mouth as needed (reflux).   VITAMIN D PO Take 1 capsule by mouth daily.   VITAMIN E PO Take 1 capsule by mouth daily.     Allergies:    Fentanyl, Penicillins, Fexofenadine, Atorvastatin, Bupropion, Morphine, and Nitroglycerin   Social History   Socioeconomic History   Marital status: Single    Spouse name: Not on file   Number of children: Not on file   Years of education: Not on file   Highest education level: Not on file  Occupational History   Not on file  Tobacco Use   Smoking status: Never   Smokeless tobacco: Never  Substance and Sexual Activity   Alcohol use: No    Alcohol/week: 0.0 standard drinks of alcohol   Drug use: No   Sexual activity: Never    Birth control/protection: Abstinence  Other Topics Concern   Not on file  Social History Narrative   Not on file   Social Drivers of Health   Financial Resource Strain: Low Risk  (09/23/2023)   Received from [place] [facility] [employer] Strain (CARDIA)    Difficulty of Paying Living Expenses: Not hard at all  Food Insecurity: No Food Insecurity (09/23/2023)   Received from [facility] [employer] Vital Sign    Worried About Running Out of Food in the Last Year: Never true    Ran Out of Food in the Last Year: Never true  Transportation Needs: No Transportation Needs (09/23/2023)   Received from [place] [facility] the [employer] - Transportation    Lack of Transportation (Medical): No    Lack of Transportation (Non-Medical): No  Physical Activity: Not on file  Stress: Not on file  Social Connections: Not on file     Family History: The patient's family history includes Hyperlipidemia in her brother, father, and mother; Hypertension in her brother, father, and mother.  ROS:   Please see the history of present illness.    All other systems reviewed and are negative.  EKGs/Labs/Other Studies Reviewed:    The following studies were reviewed today: .Marland Kitchen   I discussed my findings with the patient   Recent Labs: No results found for requested labs within last 365 days.  Recent Lipid Panel No results  found for: "CHOL", "TRIG", "HDL", "CHOLHDL", "VLDL", "LDLCALC", "LDLDIRECT"  Physical Exam:    VS:  BP 120/78   Pulse 80   Ht 5\' 2"  (1.575 m)   Wt 164 lb 6.4 oz (74.6 kg)   SpO2 98%   BMI 30.07 kg/m     Wt Readings from Last 3 Encounters:  12/19/23 164 lb 6.4 oz (74.6 kg)  02/23/23 156 lb 12.8 oz (71.1 kg)  03/08/22 157 lb 3.2 oz (71.3 kg)     GEN: Patient is in no acute distress HEENT: Normal NECK: No JVD; No carotid bruits LYMPHATICS: No lymphadenopathy CARDIAC: Hear sounds regular, 2/6 systolic murmur at the apex. RESPIRATORY:  Clear to auscultation without rales, wheezing or rhonchi  ABDOMEN: Soft, non-tender, non-distended MUSCULOSKELETAL:  No edema; No deformity  SKIN: Warm and dry NEUROLOGIC:  Alert and oriented x 3 PSYCHIATRIC:  Normal affect   Signed, Garwin Brothers, MD  12/19/2023 10:46 AM    [facility]

## 2023-12-20 LAB — COMPREHENSIVE METABOLIC PANEL WITH GFR
ALT: 19 IU/L (ref 0–32)
AST: 23 IU/L (ref 0–40)
Albumin: 4.1 g/dL (ref 3.8–4.9)
Alkaline Phosphatase: 167 IU/L — ABNORMAL HIGH (ref 44–121)
BUN/Creatinine Ratio: 22 (ref 9–23)
BUN: 17 mg/dL (ref 6–24)
Bilirubin Total: 0.6 mg/dL (ref 0.0–1.2)
CO2: 25 mmol/L (ref 20–29)
Calcium: 8.5 mg/dL — ABNORMAL LOW (ref 8.7–10.2)
Chloride: 105 mmol/L (ref 96–106)
Creatinine, Ser: 0.78 mg/dL (ref 0.57–1.00)
Globulin, Total: 2.6 g/dL (ref 1.5–4.5)
Glucose: 138 mg/dL — ABNORMAL HIGH (ref 70–99)
Potassium: 3.6 mmol/L (ref 3.5–5.2)
Sodium: 144 mmol/L (ref 134–144)
Total Protein: 6.7 g/dL (ref 6.0–8.5)
eGFR: 91 mL/min/{1.73_m2} (ref >59)

## 2023-12-20 LAB — CBC
Hematocrit: 36 % (ref 34.0–46.6)
Hemoglobin: 12.4 g/dL (ref 11.1–15.9)
MCH: 29.7 pg (ref 26.6–33.0)
MCHC: 34.4 g/dL (ref 31.5–35.7)
MCV: 86 fL (ref 79–97)
Platelets: 184 10*3/uL (ref 150–450)
RBC: 4.17 x10E6/uL (ref 3.77–5.28)
RDW: 12.2 % (ref 11.7–15.4)
WBC: 4.9 10*3/uL (ref 3.4–10.8)

## 2023-12-20 NOTE — Telephone Encounter (Signed)
 Scheduled the patient for consult with Dr. Excell Seltzer 01/14/2024. She was grateful for call and agreed with plan.

## 2023-12-24 ENCOUNTER — Telehealth: Payer: Self-pay | Admitting: Cardiology

## 2023-12-24 NOTE — Telephone Encounter (Signed)
 TEE rescheduled for 01/08/24 8:30 as pt is sick. Pt verbalized understanding and had no additional questions.

## 2023-12-24 NOTE — Telephone Encounter (Signed)
 Patient is sick and needs to cancel and reschedule her procedure on tomorrow. Please advise

## 2023-12-25 DIAGNOSIS — Q2112 Patent foramen ovale: Secondary | ICD-10-CM

## 2024-01-07 ENCOUNTER — Other Ambulatory Visit: Payer: Self-pay

## 2024-01-07 DIAGNOSIS — Q2112 Patent foramen ovale: Secondary | ICD-10-CM

## 2024-01-08 DIAGNOSIS — Q2112 Patent foramen ovale: Secondary | ICD-10-CM

## 2024-01-14 ENCOUNTER — Ambulatory Visit: Admitting: Cardiovascular Disease

## 2024-01-21 ENCOUNTER — Telehealth: Payer: Self-pay

## 2024-01-21 NOTE — Telephone Encounter (Signed)
 I received a notification that the pt had not reviewed her my chart instructions in regards to 5/9 TEE.  I called the pt and she states that there has been a death in her family and the funeral will most likely be this Friday. She asked me to cancel TEE and she did not wish to reschedule at this time.  The pt states that she is scheduled for evaluation with [facility] on 5/16 and this is for PFO evaluation.  The pt currently has a pending appointment on  5/29 with Dr Arlester Ladd for PFO consult. Will plan to contact the patient after evaluation at [facility] to determine whether she will be undergoing evaluation at [facility].

## 2024-01-25 ENCOUNTER — Ambulatory Visit (HOSPITAL_COMMUNITY): Admission: RE | Admit: 2024-01-25 | Source: Home / Self Care | Admitting: Cardiovascular Disease

## 2024-01-25 ENCOUNTER — Encounter (HOSPITAL_COMMUNITY): Admission: RE | Payer: Self-pay | Source: Home / Self Care

## 2024-01-25 DIAGNOSIS — Q2112 Patent foramen ovale: Secondary | ICD-10-CM

## 2024-01-25 IMAGING — CT CT ANGIO CHEST
2 of 6 series · 18 of 46 positions shown · IV contrast (agent unspecified)
Comparison: None Available.

CLINICAL DATA: Chest pain

EXAM:
CT ANGIOGRAPHY CHEST WITH CONTRAST
TECHNIQUE: Multidetector CT imaging of the chest was performed using the
standard protocol during bolus administration of intravenous
contrast. Multiplanar CT image reconstructions and MIPs were
obtained to evaluate the vascular anatomy. Noncontrast images of
chest were also obtained.

[Series 7: dissection 2mm · axial · 0.84mm/px · z∈[+1148,+1442]mm · 15 of 163 slices shown]
[im 8/163  lung]
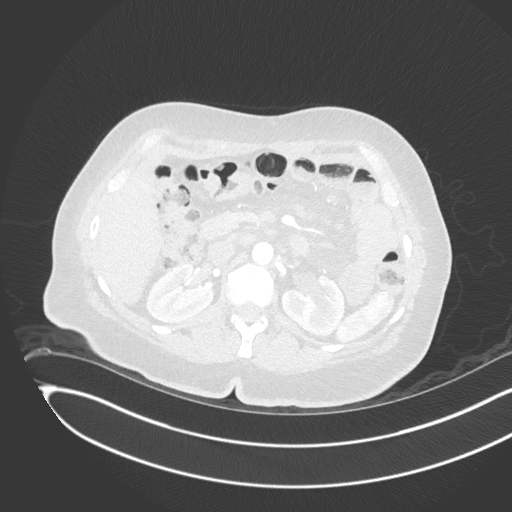
[im 23/163  soft-tissue]
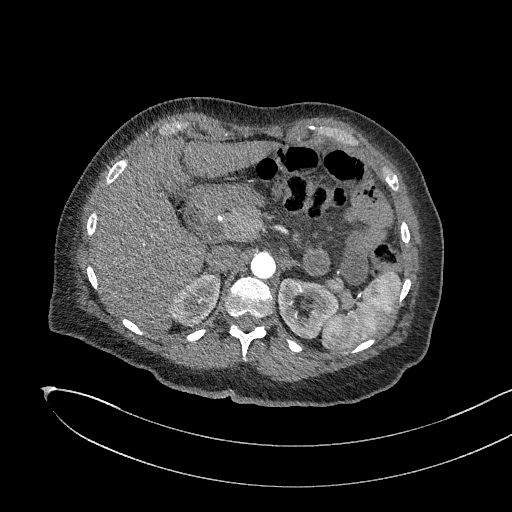
[im 30/163  lung]
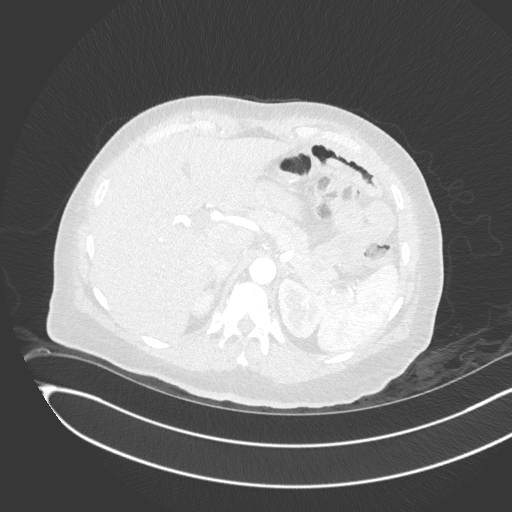
[im 37/163  soft-tissue]
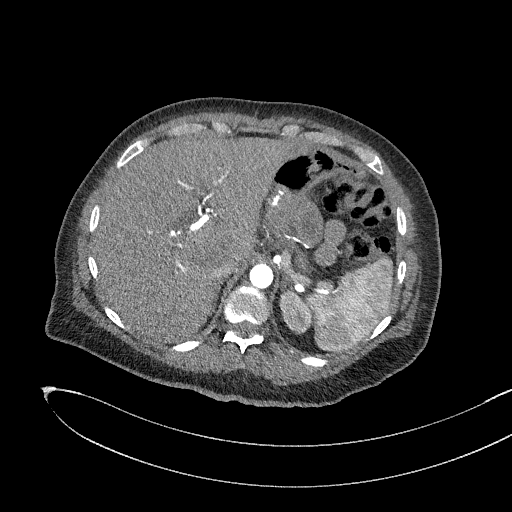
[im 52/163  lung]
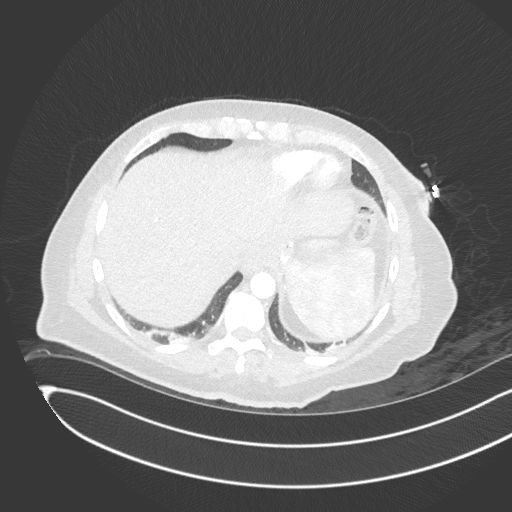
[im 59/163  soft-tissue]
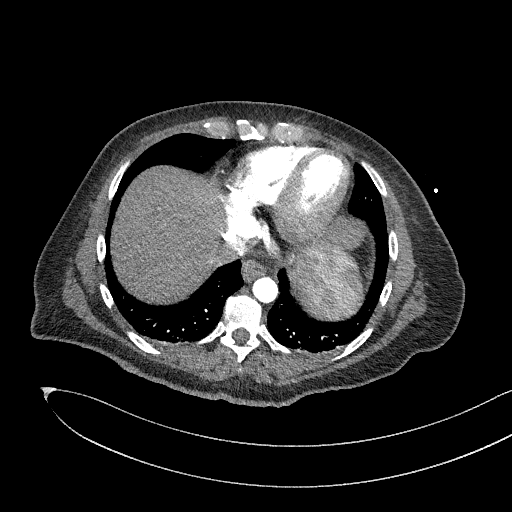
[im 74/163  lung]
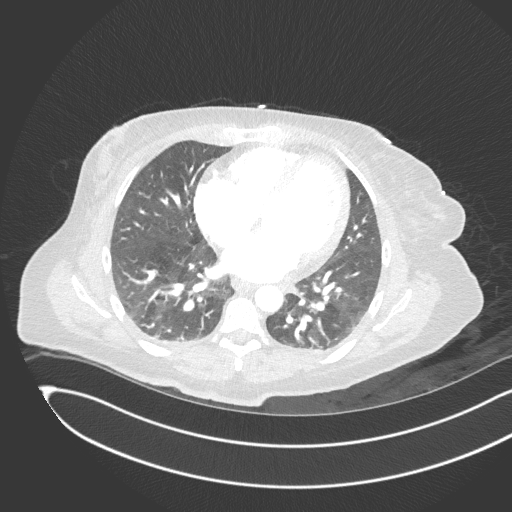
[im 82/163  soft-tissue]
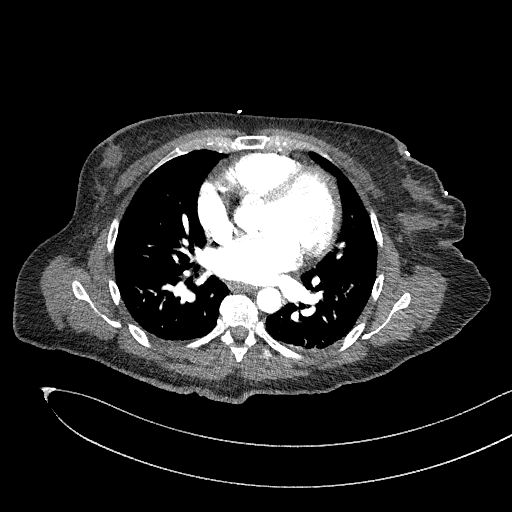
[im 89/163  lung]
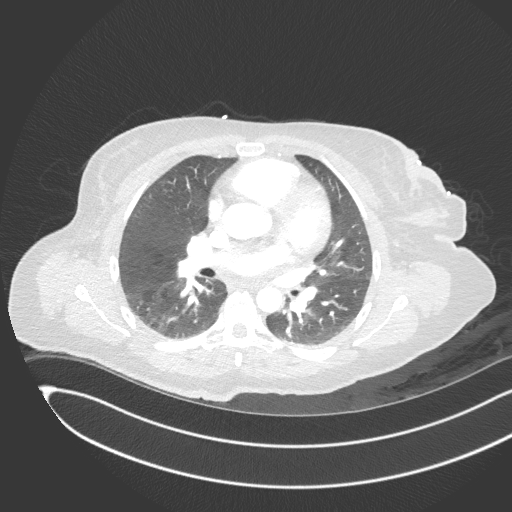
[im 104/163  soft-tissue]
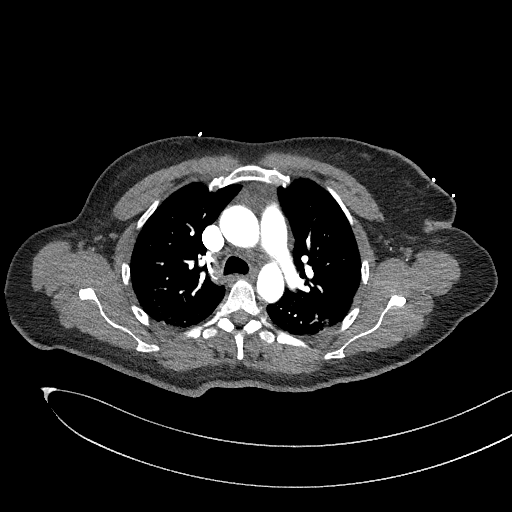
[im 111/163  lung]
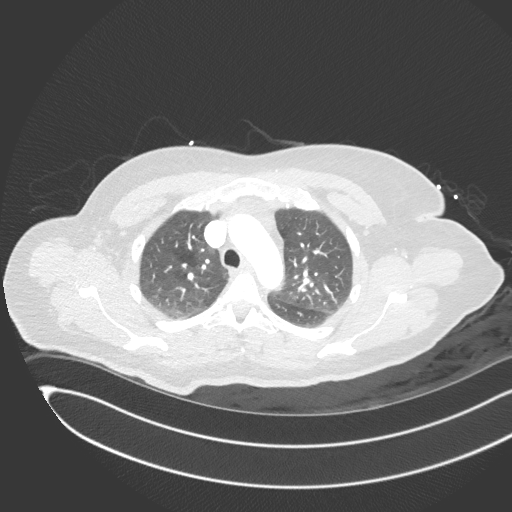
[im 126/163  soft-tissue]
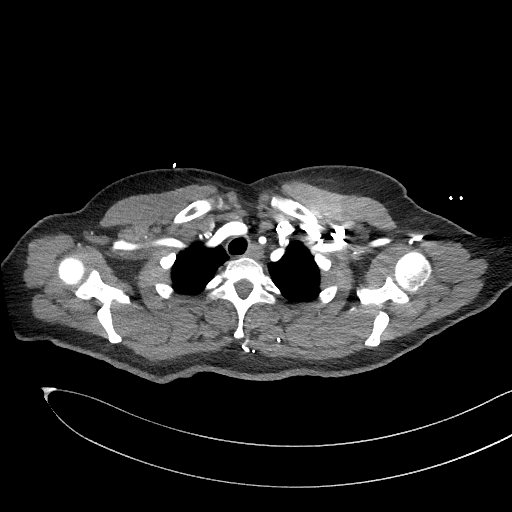
[im 133/163  lung]
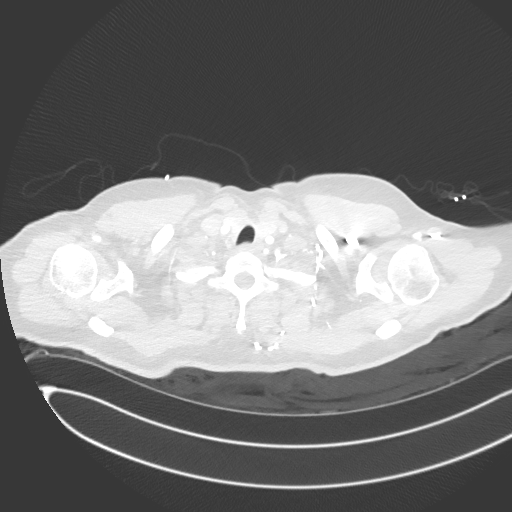
[im 140/163  soft-tissue]
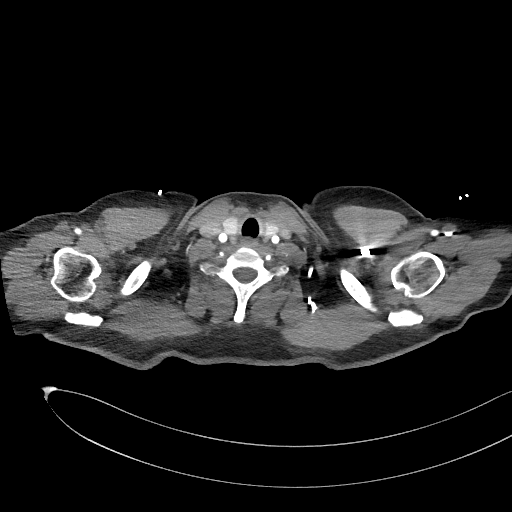
[im 155/163  lung]
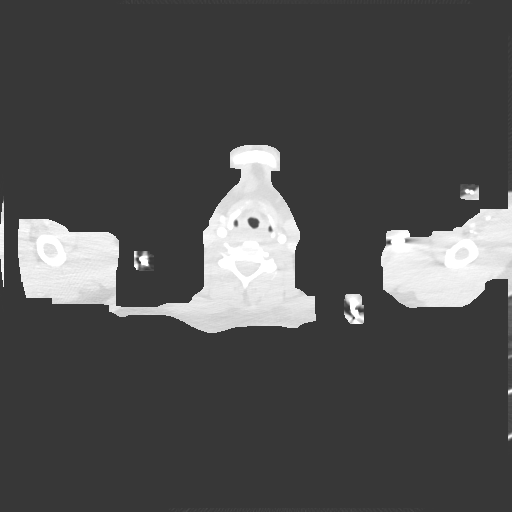

[Series 10: dissection 2mm cor · coronal · 0.67mm/px · 3 of 146 slices shown]
[im 37/146  soft-tissue]
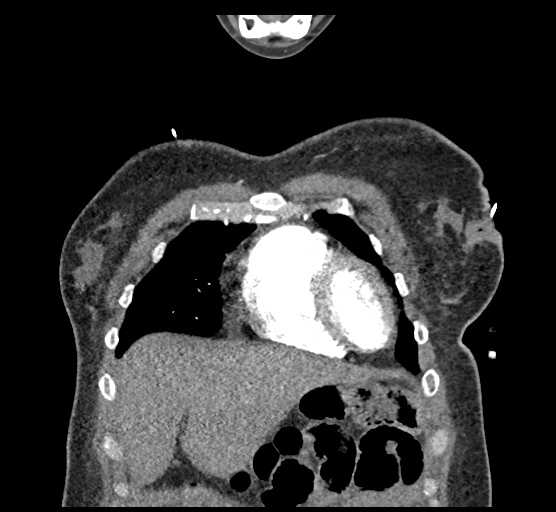
[im 73/146  soft-tissue]
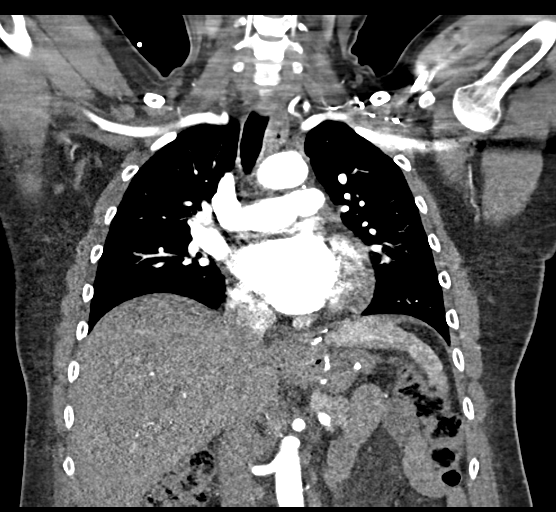
[im 109/146  soft-tissue]
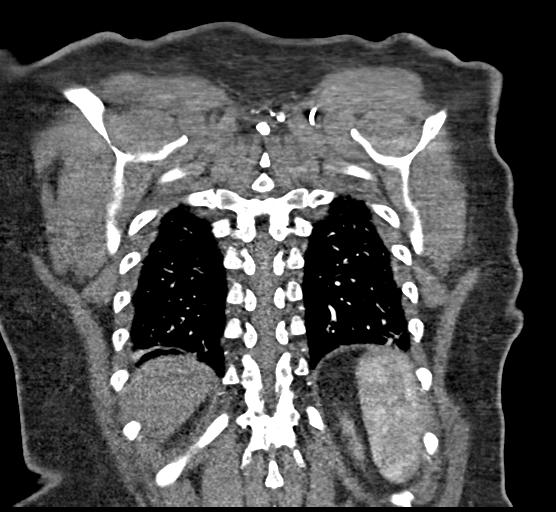

[18 of 46 positions shown; findings below may reference images not displayed]

RADIATION DOSE REDUCTION: This exam was performed according to the
departmental dose-optimization program which includes automated
exposure control, adjustment of the mA and/or kV according to
patient size and/or use of iterative reconstruction technique.

CONTRAST:  100mL OMNIPAQUE IOHEXOL 350 MG/ML SOLN
FINDINGS: Cardiovascular: There is no evidence of mural hematoma in thoracic
aorta in the noncontrast images. There is homogeneous enhancement in
thoracic aorta in the postcontrast images. There is no demonstrable
intimal flap. Scattered coronary artery calcifications are seen.
Major branches of thoracic aorta appear patent. There are no filling
defects in the pulmonary artery branches.

Mediastinum/Nodes: There is no evidence of mediastinal hematoma.
There is no significant lymphadenopathy.

Lungs/Pleura: There is no focal pulmonary consolidation. Small
linear densities in the posterior costophrenic angles may suggest
minimal scarring or subsegmental atelectasis. There is no pleural
effusion or pneumothorax.

Upper Abdomen: There is fatty infiltration in the liver. Surgical
clips are seen in gallbladder fossa. Surgical staples are seen in
the stomach.

Musculoskeletal: Unremarkable.

Review of the MIP images confirms the above findings.
IMPRESSION: There is no evidence of thoracic aortic dissection. There is no
evidence pulmonary artery embolism. Scattered coronary artery
calcifications are seen.

Small linear densities in the posterior costophrenic angles on both
sides may suggest minimal scarring or subsegmental atelectasis.
There is no focal pulmonary consolidation. There is no pleural
effusion or pneumothorax.

Fatty liver.

## 2024-01-25 SURGERY — TRANSESOPHAGEAL ECHOCARDIOGRAM (TEE) (CATHLAB)
Anesthesia: Monitor Anesthesia Care

## 2024-02-04 ENCOUNTER — Telehealth: Payer: Self-pay

## 2024-02-04 NOTE — Telephone Encounter (Signed)
 The patient is moving all of her care to [facility]. She requested to cancel upcoming visit with Dr. Arlester Ladd.  Appointment cancelled.

## 2024-02-14 ENCOUNTER — Ambulatory Visit: Admitting: Cardiovascular Disease

## 2024-08-25 ENCOUNTER — Emergency Department
Admission: EM | Admit: 2024-08-25 | Discharge: 2024-08-25 | Disposition: A | Discharge status: Home / Self Care | Payer: Auto Insurance (includes no fault) | Attending: Physician Assistant | Admitting: Physician Assistant

## 2024-08-25 ENCOUNTER — Encounter (HOSPITAL_COMMUNITY): Payer: Self-pay

## 2024-08-25 ENCOUNTER — Emergency Department (HOSPITAL_COMMUNITY): Payer: Auto Insurance (includes no fault)

## 2024-08-25 ENCOUNTER — Other Ambulatory Visit: Payer: Self-pay

## 2024-08-25 DIAGNOSIS — S29019A Strain of muscle and tendon of unspecified wall of thorax, initial encounter: Secondary | ICD-10-CM

## 2024-08-25 DIAGNOSIS — Y92411 Interstate highway as the place of occurrence of the external cause: Secondary | ICD-10-CM | POA: Insufficient documentation

## 2024-08-25 DIAGNOSIS — S161XXA Strain of muscle, fascia and tendon at neck level, initial encounter: Secondary | ICD-10-CM | POA: Insufficient documentation

## 2024-08-25 DIAGNOSIS — R0789 Other chest pain: Secondary | ICD-10-CM | POA: Insufficient documentation

## 2024-08-25 DIAGNOSIS — S60512A Abrasion of left hand, initial encounter: Secondary | ICD-10-CM | POA: Insufficient documentation

## 2024-08-25 DIAGNOSIS — E876 Hypokalemia: Secondary | ICD-10-CM | POA: Insufficient documentation

## 2024-08-25 DIAGNOSIS — S29012A Strain of muscle and tendon of back wall of thorax, initial encounter: Secondary | ICD-10-CM | POA: Insufficient documentation

## 2024-08-25 DIAGNOSIS — Z23 Encounter for immunization: Secondary | ICD-10-CM | POA: Insufficient documentation

## 2024-08-25 DIAGNOSIS — N390 Urinary tract infection, site not specified: Secondary | ICD-10-CM | POA: Insufficient documentation

## 2024-08-25 DIAGNOSIS — R1032 Left lower quadrant pain: Secondary | ICD-10-CM | POA: Insufficient documentation

## 2024-08-25 LAB — CBC WITH DIFF
BASOPHIL #: <0.1 x10ˆ3/uL (ref <=0.20)
BASOPHIL %: 0.4 %
EOSINOPHIL #: 0.15 x10ˆ3/uL (ref <=0.50)
EOSINOPHIL %: 3.2 %
HCT: 37.8 % (ref 34.8–46.0)
HGB: 12.3 g/dL (ref 11.5–16.0)
IMMATURE GRANULOCYTE #: <0.1 x10ˆ3/uL (ref <0.10)
IMMATURE GRANULOCYTE %: 0.2 % (ref 0.0–1.0)
LYMPHOCYTE #: 1.53 x10ˆ3/uL (ref 1.00–4.80)
LYMPHOCYTE %: 32.8 %
MCH: 28 pg (ref 26.0–32.0)
MCHC: 32.5 g/dL (ref 31.0–35.5)
MCV: 86.1 fL (ref 78.0–100.0)
MONOCYTE #: 0.39 x10ˆ3/uL (ref 0.20–1.10)
MONOCYTE %: 8.4 %
MPV: 9.4 fL (ref 8.7–12.5)
NEUTROPHIL #: 2.56 x10ˆ3/uL (ref 1.50–7.70)
NEUTROPHIL %: 55 %
PLATELETS: 235 x10ˆ3/uL (ref 150–400)
RBC: 4.39 x10ˆ6/uL (ref 3.85–5.22)
RDW-CV: 13.1 % (ref 11.5–15.5)
WBC: 4.7 x10ˆ3/uL (ref 3.7–11.0)

## 2024-08-25 LAB — PTT (PARTIAL THROMBOPLASTIN TIME): APTT: 35.7 s — ABNORMAL HIGH (ref 26.5–34.6)

## 2024-08-25 LAB — COMPREHENSIVE METABOLIC PANEL, NON-FASTING
ALBUMIN: 3.9 g/dL (ref 3.5–5.0)
ALKALINE PHOSPHATASE: 172 U/L — ABNORMAL HIGH (ref 50–130)
ALT (SGPT): 41 U/L — ABNORMAL HIGH (ref <31)
ANION GAP: 6 mmol/L (ref 4–13)
AST (SGOT): 41 U/L — ABNORMAL HIGH (ref 11–34)
BILIRUBIN TOTAL: 0.5 mg/dL (ref 0.3–1.3)
BUN/CREA RATIO: 12 (ref 6–22)
BUN: 9 mg/dL (ref 8–25)
CALCIUM: 8.7 mg/dL (ref 8.6–10.2)
CHLORIDE: 110 mmol/L (ref 96–111)
CO2 TOTAL: 27 mmol/L (ref 22–30)
CREATININE: 0.75 mg/dL (ref 0.60–1.05)
GLUCOSE: 96 mg/dL (ref 65–125)
POTASSIUM: 3.3 mmol/L — ABNORMAL LOW (ref 3.5–5.1)
PROTEIN TOTAL: 6.9 g/dL (ref 6.4–8.3)
SODIUM: 143 mmol/L (ref 136–145)
eGFRcr - FEMALE: >90 mL/min/1.73mˆ2 (ref >=60)

## 2024-08-25 LAB — URINALYSIS, MACRO/MICRO
BILIRUBIN: NEGATIVE mg/dL
BLOOD: NEGATIVE mg/dL
GLUCOSE: NEGATIVE mg/dL
KETONES: NEGATIVE mg/dL
LEUKOCYTES: NEGATIVE WBCs/uL
NITRITE: POSITIVE — AB
PH: 6 (ref 4.6–8.0)
PROTEIN: NEGATIVE mg/dL
SPECIFIC GRAVITY: 1.015 (ref 1.003–1.035)
UROBILINOGEN: 0.2 mg/dL (ref 0.2–1.0)

## 2024-08-25 LAB — PT/INR
INR: 1 (ref 0.94–1.14)
PROTHROMBIN TIME: 11.6 s (ref 10.5–12.7)

## 2024-08-25 LAB — URINALYSIS, MICROSCOPIC

## 2024-08-25 LAB — LACTIC ACID LEVEL W/ REFLEX FOR LEVEL >2.0: LACTIC ACID: 0.8 mmol/L (ref 0.5–2.2)

## 2024-08-25 LAB — MAGNESIUM: MAGNESIUM: 2.1 mg/dL (ref 1.8–2.6)

## 2024-08-25 MED ORDER — IOPAMIDOL 370 MG IODINE/ML (76 %) INTRAVENOUS SOLUTION
80.0000 mL | INTRAVENOUS | Status: AC
  Administered 2024-08-25: 80 mL via INTRAVENOUS

## 2024-08-25 MED ORDER — CIPROFLOXACIN 500 MG TABLET: 500.0000 mg | ORAL_TABLET | Freq: Two times a day (BID) | ORAL | 0 refills | Status: AC

## 2024-08-25 MED ORDER — METHOCARBAMOL 750 MG TABLET: 750.0000 mg | ORAL_TABLET | Freq: Three times a day (TID) | ORAL | 0 refills | Status: AC

## 2024-08-25 MED ORDER — ACETAMINOPHEN 325 MG TABLET
650.0000 mg | ORAL_TABLET | ORAL | Status: AC
  Administered 2024-08-25: 650 mg via ORAL
  Filled 2024-08-25: qty 2

## 2024-08-25 MED ORDER — ONDANSETRON HCL (PF) 4 MG/2 ML INJECTION SOLUTION
4.0000 mg | INTRAMUSCULAR | Status: AC
  Administered 2024-08-25: 4 mg via INTRAVENOUS
  Filled 2024-08-25: qty 2

## 2024-08-25 MED ORDER — POTASSIUM CHLORIDE ER 10 MEQ TABLET,EXTENDED RELEASE(PART/CRYST)
40.0000 meq | ORAL_TABLET | ORAL | Status: AC
  Administered 2024-08-25: 40 meq via ORAL
  Filled 2024-08-25: qty 4

## 2024-08-25 MED ORDER — HYDROCODONE 5 MG-ACETAMINOPHEN 325 MG TABLET: 1.0000 | ORAL_TABLET | Freq: Four times a day (QID) | ORAL | 0 refills | Status: AC | PRN

## 2024-08-25 MED ORDER — DIPHTH,PERTUSSIS(ACEL),TETANUS 2.5 LF UNIT-8 MCG-5 LF/0.5ML IM SYRINGE
0.5000 mL | INJECTION | INTRAMUSCULAR | Status: AC
  Administered 2024-08-25: 0.5 mL via INTRAMUSCULAR
  Filled 2024-08-25: qty 0.5

## 2024-08-25 NOTE — ED Nurses Note (Signed)
 Patient ambulated to BR with minimal assistance. Patient tolerated well.

## 2024-08-25 NOTE — ED Nurses Note (Signed)
 Patient getting dressed. Monitor removed.

## 2024-08-25 NOTE — ED Nurses Note (Signed)
 Patient clothes removed, GEANNIE Mellow PA @ bedside for assessment.

## 2024-08-25 NOTE — ED Notes (Signed)
 Called priority 2 trauma 1254

## 2024-08-25 NOTE — Discharge Instructions (Signed)
 Take antibiotic as prescribed to completion for urinary tract infection.  Take Robaxin  1-2 tabs up to 3 times daily for muscle spasm.  Take over-the-counter Tylenol  for pain, take Norco for more severe pain, caution advised as this medicine will cause constipation.  Recommend close follow-up with your primary care provider for continuation of care, return to ER immediately with any worsening of symptoms.
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
## 2024-08-25 NOTE — ED Nurses Note (Signed)
 Right hand washed with chlorhexidine. Patient tolerated well.

## 2024-08-25 NOTE — ED Nurses Note (Signed)
 Patient ambulated to [facility] with assistance of nurse. Patient steady gait. Patient adamant going to [facility]

## 2024-08-25 NOTE — ED Nurses Note (Signed)
 Lab and Rad @ patient bedside

## 2024-08-27 LAB — URINE CULTURE,ROUTINE: URINE CULTURE: >100000 — AB

## 2024-08-28 ENCOUNTER — Ambulatory Visit (HOSPITAL_COMMUNITY): Payer: Self-pay

## 2024-08-28 NOTE — Result Encounter Note (Signed)
 08/28/24: 1151- Called patient and informed her about her urine culture results and the need to change her antibiotics according to its recommendations, as per reviewed by ED provider. Patient to be prescribed keflex 500mg  one orally three times a day for seven days, no refills per Dr. Duayne. Patient [place] and the pharmacy she needs her prescription sent to is as follows: [facility] [address], [place], [place] [address]. # [phone]    08/28/24: 1156- Called [facility] and patient's prescription as stated above was called in to [facility] the pharmacist.
